# Patient Record
Sex: Female | Born: 1980 | Race: Black or African American | Hispanic: No | Marital: Single | Smoking: Never smoker
Health system: Southern US, Community
[De-identification: ages and names within clinical notes are randomized; demographics above are authoritative.]

## PROBLEM LIST (undated history)

## (undated) DIAGNOSIS — J45909 Unspecified asthma, uncomplicated: Secondary | ICD-10-CM

## (undated) DIAGNOSIS — E669 Obesity, unspecified: Secondary | ICD-10-CM

## (undated) HISTORY — PX: DILATION AND CURETTAGE OF UTERUS: SHX78

---

## 2015-01-01 ENCOUNTER — Emergency Department (HOSPITAL_COMMUNITY)
Admission: EM | Admit: 2015-01-01 | Discharge: 2015-01-01 | Disposition: A | Discharge status: Home / Self Care | Payer: Self-pay | Attending: Emergency Medicine | Admitting: Emergency Medicine

## 2015-01-01 ENCOUNTER — Encounter (HOSPITAL_COMMUNITY): Payer: Self-pay | Admitting: *Deleted

## 2015-01-01 DIAGNOSIS — E669 Obesity, unspecified: Secondary | ICD-10-CM | POA: Insufficient documentation

## 2015-01-01 DIAGNOSIS — N939 Abnormal uterine and vaginal bleeding, unspecified: Secondary | ICD-10-CM | POA: Insufficient documentation

## 2015-01-01 DIAGNOSIS — R109 Unspecified abdominal pain: Secondary | ICD-10-CM

## 2015-01-01 DIAGNOSIS — N390 Urinary tract infection, site not specified: Secondary | ICD-10-CM | POA: Insufficient documentation

## 2015-01-01 DIAGNOSIS — J45909 Unspecified asthma, uncomplicated: Secondary | ICD-10-CM | POA: Insufficient documentation

## 2015-01-01 HISTORY — DX: Obesity, unspecified: E66.9

## 2015-01-01 HISTORY — DX: Unspecified asthma, uncomplicated: J45.909

## 2015-01-01 LAB — CBC WITH DIFFERENTIAL/PLATELET
Basophils Absolute: 0 10*3/uL (ref 0.0–0.1)
Basophils Relative: 0 % (ref 0–1)
Eosinophils Absolute: 0.1 10*3/uL (ref 0.0–0.7)
Eosinophils Relative: 1 % (ref 0–5)
HEMATOCRIT: 35.3 % — AB (ref 36.0–46.0)
Hemoglobin: 11.7 g/dL — ABNORMAL LOW (ref 12.0–15.0)
LYMPHS PCT: 36 % (ref 12–46)
Lymphs Abs: 2.9 10*3/uL (ref 0.7–4.0)
MCH: 27.7 pg (ref 26.0–34.0)
MCHC: 33.1 g/dL (ref 30.0–36.0)
MCV: 83.6 fL (ref 78.0–100.0)
Monocytes Absolute: 0.5 10*3/uL (ref 0.1–1.0)
Monocytes Relative: 6 % (ref 3–12)
NEUTROS ABS: 4.6 10*3/uL (ref 1.7–7.7)
Neutrophils Relative %: 57 % (ref 43–77)
Platelets: 230 10*3/uL (ref 150–400)
RBC: 4.22 MIL/uL (ref 3.87–5.11)
RDW: 14.3 % (ref 11.5–15.5)
WBC: 8.1 10*3/uL (ref 4.0–10.5)

## 2015-01-01 LAB — URINALYSIS, ROUTINE W REFLEX MICROSCOPIC
GLUCOSE, UA: NEGATIVE mg/dL
Ketones, ur: 15 mg/dL — AB
Nitrite: POSITIVE — AB
PROTEIN: 30 mg/dL — AB
Specific Gravity, Urine: 1.031 — ABNORMAL HIGH (ref 1.005–1.030)
Urobilinogen, UA: 1 mg/dL (ref 0.0–1.0)
pH: 5 (ref 5.0–8.0)

## 2015-01-01 LAB — BASIC METABOLIC PANEL
Anion gap: 9 (ref 5–15)
BUN: 8 mg/dL (ref 6–23)
CHLORIDE: 106 mmol/L (ref 96–112)
CO2: 24 mmol/L (ref 19–32)
Calcium: 8.9 mg/dL (ref 8.4–10.5)
Creatinine, Ser: 0.9 mg/dL (ref 0.50–1.10)
GFR calc non Af Amer: 82 mL/min — ABNORMAL LOW (ref >90)
GLUCOSE: 122 mg/dL — AB (ref 70–99)
Potassium: 4.1 mmol/L (ref 3.5–5.1)
Sodium: 139 mmol/L (ref 135–145)

## 2015-01-01 LAB — WET PREP, GENITAL
TRICH WET PREP: NONE SEEN
Yeast Wet Prep HPF POC: NONE SEEN

## 2015-01-01 LAB — GC/CHLAMYDIA PROBE AMP (~~LOC~~) NOT AT ARMC
Chlamydia: NEGATIVE
Neisseria Gonorrhea: NEGATIVE

## 2015-01-01 LAB — URINE MICROSCOPIC-ADD ON

## 2015-01-01 LAB — HIV ANTIBODY (ROUTINE TESTING W REFLEX): HIV SCREEN 4TH GENERATION: NONREACTIVE

## 2015-01-01 LAB — I-STAT BETA HCG BLOOD, ED (MC, WL, AP ONLY): I-stat hCG, quantitative: <5 m[IU]/mL (ref <5)

## 2015-01-01 MED ORDER — CEPHALEXIN 500 MG PO CAPS: 500.0000 mg | ORAL_CAPSULE | Freq: Two times a day (BID) | ORAL | Status: DC

## 2015-01-01 NOTE — Discharge Instructions (Signed)

## 2015-01-01 NOTE — ED Provider Notes (Signed)
CSN: [id number]     Arrival date & time 01/01/15  0720 History   First MD Initiated Contact with Patient 01/01/15 [phone]     Chief Complaint  Patient presents with  . Abdominal Pain     (Consider location/radiation/quality/duration/timing/severity/associated sxs/prior Treatment) HPI Comments: Pt comes in because she thinks she is having a miscarriage. Pt states that she has been pregnant 24 times previous with 8 live birth and 8 spontaneous miscarriages and 8 elective abortions. She states that she doesn't have regular periods and she is unsure of when the last period was. State that she started having a large about of bleeding with clots and generalized lower abdominal cramping. Denies dysuria, fever, n/v/d  The history is provided by the patient. No language interpreter was used.    Past Medical History  Diagnosis Date  . Obesity   . Asthma    History reviewed. No pertinent past surgical history. History reviewed. No pertinent family history. History  Substance Use Topics  . Smoking status: Not on file  . Smokeless tobacco: Not on file  . Alcohol Use: No   OB History    No data available     Review of Systems  All other systems reviewed and are negative.     Allergies  Review of patient's allergies indicates no known allergies.  Home Medications   Prior to Admission medications   Not on File   BP 141/74 mmHg  Pulse 70  Temp(Src) 97.9 F (36.6 C) (Oral)  Resp 20  Ht 5\' 2"  (1.575 m)  Wt 210 lb (95.255 kg)  BMI 38.40 kg/m2  SpO2 99% Physical Exam  Constitutional: She is oriented to person, place, and time. She appears well-developed and well-nourished.  HENT:  Head: Normocephalic and atraumatic.  Cardiovascular: Normal rate.   Pulmonary/Chest: Effort normal and breath sounds normal.  Abdominal: Soft. Bowel sounds are normal. There is no tenderness.  Genitourinary:  Mild amount of blood in vault  Musculoskeletal: Normal range of motion.  Neurological: She  is alert and oriented to person, place, and time.  Skin: Skin is warm and dry.  Nursing note and vitals reviewed.   ED Course  Procedures (including critical care time) Labs Review Labs Reviewed  WET PREP, GENITAL - Abnormal; Notable for the following:    Clue Cells Wet Prep HPF POC FEW (*)    WBC, Wet Prep HPF POC MODERATE (*)    All other components within normal limits  URINALYSIS, ROUTINE W REFLEX MICROSCOPIC - Abnormal; Notable for the following:    Color, Urine AMBER (*)    APPearance CLOUDY (*)    Specific Gravity, Urine 1.031 (*)    Hgb urine dipstick LARGE (*)    Bilirubin Urine SMALL (*)    Ketones, ur 15 (*)    Protein, ur 30 (*)    Nitrite POSITIVE (*)    Leukocytes, UA TRACE (*)    All other components within normal limits  BASIC METABOLIC PANEL - Abnormal; Notable for the following:    Glucose, Bld 122 (*)    GFR calc non Af Amer 82 (*)    All other components within normal limits  CBC WITH DIFFERENTIAL/PLATELET - Abnormal; Notable for the following:    Hemoglobin 11.7 (*)    HCT 35.3 (*)    All other components within normal limits  URINE MICROSCOPIC-ADD ON - Abnormal; Notable for the following:    Squamous Epithelial / LPF FEW (*)    Bacteria, UA MANY (*)  All other components within normal limits  HIV ANTIBODY (ROUTINE TESTING)  I-STAT BETA HCG BLOOD, ED ([facility], [facility], [facility] ONLY)  GC/CHLAMYDIA PROBE AMP ([place])    Imaging Review No results found.   EKG Interpretation None      MDM   Final diagnoses:  UTI (lower urinary tract infection)  Vaginal bleeding  Abdominal cramping    Pt is not pregnant and she left prior to discharge and getting script for uti    Teressa Lower, NP 01/01/15 1708  Gerhard Munch, MD 01/01/15 1718

## 2015-01-01 NOTE — ED Notes (Signed)
Pt still not in room. Checked lobby and waiting room with no response. NP and primary RN made aware.

## 2015-01-01 NOTE — ED Notes (Signed)
Pt noted to no longer be in her room.

## 2015-01-01 NOTE — ED Notes (Signed)
PT left prior to receiving discharge instructions or discharge vitals. Pt in NAD.

## 2015-01-01 NOTE — ED Notes (Signed)
Pt reports being pregnant, estimated 4-5 weeks. Began having lower abd pain and vaginal bleeding that started at 1am.

## 2017-10-19 ENCOUNTER — Encounter (HOSPITAL_COMMUNITY): Payer: Self-pay | Admitting: Emergency Medicine

## 2017-10-19 ENCOUNTER — Other Ambulatory Visit: Payer: Self-pay

## 2017-10-19 ENCOUNTER — Emergency Department (HOSPITAL_COMMUNITY)
Admission: EM | Admit: 2017-10-19 | Discharge: 2017-10-20 | Disposition: A | Discharge status: Home / Self Care | Payer: Self-pay | Attending: Emergency Medicine | Admitting: Emergency Medicine

## 2017-10-19 DIAGNOSIS — J069 Acute upper respiratory infection, unspecified: Secondary | ICD-10-CM | POA: Insufficient documentation

## 2017-10-19 DIAGNOSIS — J349 Unspecified disorder of nose and nasal sinuses: Secondary | ICD-10-CM | POA: Insufficient documentation

## 2017-10-19 DIAGNOSIS — J3489 Other specified disorders of nose and nasal sinuses: Secondary | ICD-10-CM | POA: Insufficient documentation

## 2017-10-19 DIAGNOSIS — J45909 Unspecified asthma, uncomplicated: Secondary | ICD-10-CM | POA: Insufficient documentation

## 2017-10-19 DIAGNOSIS — B9789 Other viral agents as the cause of diseases classified elsewhere: Secondary | ICD-10-CM | POA: Insufficient documentation

## 2017-10-19 DIAGNOSIS — Z79899 Other long term (current) drug therapy: Secondary | ICD-10-CM | POA: Insufficient documentation

## 2017-10-19 DIAGNOSIS — R0981 Nasal congestion: Secondary | ICD-10-CM | POA: Insufficient documentation

## 2017-10-19 NOTE — ED Triage Notes (Signed)
Pt states she works in [employer] service and her employers wanted her to be checked for nasal congestion and a cough. Denies shortness of breath/chest pain, pt afebrile. Requesting a work note.

## 2017-10-20 MED ORDER — BENZONATATE 100 MG PO CAPS
100.0000 mg | ORAL_CAPSULE | Freq: Once | ORAL | Status: AC
  Administered 2017-10-20: 100 mg via ORAL
  Filled 2017-10-20: qty 1

## 2017-10-20 MED ORDER — IPRATROPIUM-ALBUTEROL 0.5-2.5 (3) MG/3ML IN SOLN
3.0000 mL | Freq: Once | RESPIRATORY_TRACT | Status: AC
  Administered 2017-10-20: 3 mL via RESPIRATORY_TRACT
  Filled 2017-10-20: qty 3

## 2017-10-20 MED ORDER — FLUTICASONE PROPIONATE 50 MCG/ACT NA SUSP: 2.0000 | Freq: Every day | NASAL | 12 refills | Status: DC

## 2017-10-20 MED ORDER — BENZONATATE 100 MG PO CAPS: 100.0000 mg | ORAL_CAPSULE | Freq: Three times a day (TID) | ORAL | 0 refills | Status: DC

## 2017-10-20 NOTE — Discharge Instructions (Signed)
This is likely a viral illness.  Do not feel that you need antibiotics at this time.  Encourage symptomatic treatment at home.  Have given you Tessalon for cough.  Use the Flonase for nasal congestion.  Use over-the-counter decongestants as well.  Return to the ED with any worsening symptoms.

## 2017-10-20 NOTE — ED Notes (Signed)
Pt departed in NAD, refused use of wheelchair.  

## 2017-10-20 NOTE — ED Provider Notes (Signed)
[facility] Provider Note   CSN: [id number] Arrival date & time: 10/19/17  2242     History   Chief Complaint Chief Complaint  Patient presents with  . Cough    HPI Amy Reynolds is a 36 y.o. female.  HPI 36 year old African-American female past medical history significant for asthma presents to the emergency department today for evaluation of nasal congestion, cough.  The patient states that for the past 3 days she has had nasal congestion, sinus pressure, rhinorrhea, cough.  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  States that she has a history of asthma.  Does not feel like she is wheezing though.  States that her cough is not productive.  She reports a sore throat only with cough.  She reports green yellow rhinorrhea and nasal discharge.  Reports sinus pressure.  She has not tried any over-the-counter medications for her symptoms.  States that her cough is aggravating her.  She also states that her work is requesting a return to work note to make sure that she is okay to work around food.  Patient denies any associated fevers, chills, chest pain, shortness of breath, otalgia, neck pain. Past Medical History:  Diagnosis Date  . Asthma   . Obesity     There are no active problems to display for this patient.   History reviewed. No pertinent surgical history.  OB History    No data available       Home Medications    Prior to Admission medications   Medication Sig Start Date End Date Taking? Authorizing Provider  benzonatate (TESSALON) 100 MG capsule Take 1 capsule (100 mg total) by mouth every 8 (eight) hours. 10/20/17   Rise MuLeaphart, Kenneth T, PA-C  cephALEXin (KEFLEX) 500 MG capsule Take 1 capsule (500 mg total) by mouth 2 (two) times daily. 01/01/15   Teressa LowerPickering, Vrinda, NP  fluticasone (FLONASE) 50 MCG/ACT nasal spray Place 2 sprays  into both nostrils daily. 10/20/17   Rise MuLeaphart, Kenneth T, PA-C    Family History No family history on file.  Social History Social History   Tobacco Use  . Smoking status: Never Smoker  . Smokeless tobacco: Never Used  Substance Use Topics  . Alcohol use: No  . Drug use: No     Allergies   Patient has no known allergies.   Review of Systems Review of Systems  Constitutional: Negative for chills and fever.  HENT: Positive for congestion, postnasal drip, rhinorrhea, sinus pressure, sinus pain, sneezing and sore throat. Negative for ear pain.   Respiratory: Positive for cough. Negative for wheezing.   Cardiovascular: Negative for chest pain.  Gastrointestinal: Negative for nausea.  Musculoskeletal: Negative for myalgias, neck pain and neck stiffness.  Skin: Negative for rash.  Neurological: Negative for headaches.     Physical Exam Updated Vital Signs BP 126/66 (BP Location: Right Arm)   Pulse 77   Temp 98 F (36.7 C) (Oral)   Resp 16   Ht 5\' 2"  (1.575 m)   Wt 96.2 kg (212 lb)   SpO2 100%   BMI 38.78 kg/m   Physical Exam  Constitutional: She appears well-developed and well-nourished. No distress.  HENT:  Head: Normocephalic and atraumatic.  Right Ear: Tympanic membrane, external ear and ear canal normal.  Left Ear: Tympanic membrane, external ear and ear canal normal.  Nose: Mucosal edema and rhinorrhea present.  Right sinus exhibits maxillary sinus tenderness and frontal sinus tenderness. Left sinus exhibits maxillary sinus tenderness and frontal sinus tenderness.  Mouth/Throat: Uvula is midline, oropharynx is clear and moist and mucous membranes are normal. No trismus in the jaw. No uvula swelling. No tonsillar exudate.  Eyes: Right eye exhibits no discharge. Left eye exhibits no discharge. No scleral icterus.  Neck: Normal range of motion. Neck supple.  No c spine midline tenderness. No paraspinal tenderness. No deformities or step offs noted. Full ROM.  Supple. No nuchal rigidity.    Pulmonary/Chest: Effort normal and breath sounds normal. No stridor. No respiratory distress. She has no wheezes. She has no rales. She exhibits no tenderness.  Musculoskeletal: Normal range of motion.  No lower extremity edema or calf tenderness.  Neurological: She is alert.  Skin: Skin is warm and dry. Capillary refill takes less than 2 seconds. No pallor.  Psychiatric: Her behavior is normal. Judgment and thought content normal.  Nursing note and vitals reviewed.    ED Treatments / Results  Labs (all labs ordered are listed, but only abnormal results are displayed) Labs Reviewed - No data to display  EKG  EKG Interpretation None       Radiology No results found.  Procedures Procedures (including critical care time)  Medications Ordered in ED Medications  benzonatate (TESSALON) capsule 100 mg (100 mg Oral Given 10/20/17 0044)  ipratropium-albuterol (DUONEB) 0.5-2.5 (3) MG/3ML nebulizer solution 3 mL (3 mLs Nebulization Given 10/20/17 0047)     Initial Impression / Assessment and Plan / ED Course  I have reviewed the triage vital signs and the nursing notes.  Pertinent labs & imaging results that were available during my care of the patient were reviewed by me and considered in my medical decision making (see chart for details).     Patient resents for evaluation of nasal congestion, cough, sinus pressure.  Patients symptoms are consistent with URI, likely viral etiology.  I offered patient chest x-ray which she would like to decline at this time.  Her lungs clear to auscultation bilaterally.  Low suspicion for pneumonia.  Clinical presentation is not consistent with ACS.  Did offer patient cough medicine and breathing treatment however there is no wheezing noted.  This was for more symptomatic relief.  Discussed that antibiotics are not indicated for viral infections. Pt will be discharged with symptomatic treatment.  Verbalizes  understanding and is agreeable with plan. Pt is hemodynamically stable & in NAD prior to dc.   Final Clinical Impressions(s) / ED Diagnoses   Final diagnoses:  Viral URI with cough    ED Discharge Orders        Ordered    benzonatate (TESSALON) 100 MG capsule  Every 8 hours     10/20/17 0055    fluticasone (FLONASE) 50 MCG/ACT nasal spray  Daily     10/20/17 0055       Rise MuLeaphart, Kenneth T, PA-C 10/20/17 0112    Glynn Octaveancour, Stephen, MD 10/20/17 [phone]

## 2017-10-22 ENCOUNTER — Encounter (HOSPITAL_COMMUNITY): Payer: Self-pay | Admitting: Family Medicine

## 2017-10-22 DIAGNOSIS — R0981 Nasal congestion: Secondary | ICD-10-CM | POA: Insufficient documentation

## 2017-10-22 DIAGNOSIS — Z5321 Procedure and treatment not carried out due to patient leaving prior to being seen by health care provider: Secondary | ICD-10-CM | POA: Insufficient documentation

## 2017-10-22 DIAGNOSIS — J029 Acute pharyngitis, unspecified: Secondary | ICD-10-CM | POA: Insufficient documentation

## 2017-10-22 DIAGNOSIS — R05 Cough: Secondary | ICD-10-CM | POA: Insufficient documentation

## 2017-10-22 LAB — RAPID STREP SCREEN (MED CTR MEBANE ONLY): STREPTOCOCCUS, GROUP A SCREEN (DIRECT): NEGATIVE

## 2017-10-22 NOTE — ED Triage Notes (Signed)
Patient is complaining of upper respiratory symptoms such as  nasal congestion, sinus pressure, rhinorrhea, cough and sore throat. [facility] for all the symptoms except sore throat. Unsure of fever.

## 2017-10-23 ENCOUNTER — Emergency Department (HOSPITAL_COMMUNITY)
Admission: EM | Admit: 2017-10-23 | Discharge: 2017-10-23 | Disposition: A | Discharge status: Home / Self Care | Payer: Self-pay | Attending: Emergency Medicine | Admitting: Emergency Medicine

## 2017-10-23 NOTE — ED Notes (Signed)
Patient called for assigned room with no answer.  

## 2017-10-23 NOTE — ED Notes (Signed)
Bed: WTR6 Expected date:  Expected time:  Means of arrival:  Comments: 

## 2017-10-25 LAB — CULTURE, GROUP A STREP (THRC)

## 2017-11-06 NOTE — L&D Delivery Note (Signed)
Patient: Amy Reynolds MRN: [id number]  GBS status: GBS(+), IAP PCN given  Patient is a 37 y.o. now [id number] s/p NSVD at [redacted]w[redacted]d, who was admitted for IOL for GDM of unknown control. AROM 1h 4m prior to delivery with clear fluid.    Delivery Note At 12:26 AM a viable female was delivered via Vaginal, Spontaneous (Presentation: vertex).  APGAR: ; weight pending.   Placenta status: intact.  Cord: 3 vessel with the following complications: none.    Anesthesia:  Epidural Episiotomy:  No Lacerations:  1st degree perineal Suture Repair: Hemostatic, no repair needed Est. Blood Loss (mL): 380   Mom to postpartum.  Baby to Couplet care / Skin to Skin.  Amy Reynolds E Amy Reynolds 09/05/2018, 12:48 AM     Head delivered LOA. Nuchal x1. Shoulder and body delivered in usual fashion. Infant with spontaneous cry, placed on mother's abdomen, dried and bulb suctioned. Cord clamped x 2 after 1-minute delay, and cut by family member. Cord blood drawn. Placenta delivered spontaneously with gentle cord traction. Fundus firm with massage and Pitocin. Perineum inspected and found to have 1st degree laceration, which was found to be hemostatic.

## 2018-01-07 ENCOUNTER — Emergency Department (HOSPITAL_COMMUNITY)
Admission: EM | Admit: 2018-01-07 | Discharge: 2018-01-07 | Disposition: A | Discharge status: Home / Self Care | Payer: Medicaid Other | Attending: Emergency Medicine | Admitting: Emergency Medicine

## 2018-01-07 ENCOUNTER — Other Ambulatory Visit: Payer: Self-pay

## 2018-01-07 ENCOUNTER — Encounter (HOSPITAL_COMMUNITY): Payer: Self-pay

## 2018-01-07 ENCOUNTER — Emergency Department (HOSPITAL_COMMUNITY): Payer: Medicaid Other

## 2018-01-07 DIAGNOSIS — Y999 Unspecified external cause status: Secondary | ICD-10-CM | POA: Insufficient documentation

## 2018-01-07 DIAGNOSIS — Z79899 Other long term (current) drug therapy: Secondary | ICD-10-CM | POA: Diagnosis not present

## 2018-01-07 DIAGNOSIS — Y929 Unspecified place or not applicable: Secondary | ICD-10-CM | POA: Insufficient documentation

## 2018-01-07 DIAGNOSIS — Y939 Activity, unspecified: Secondary | ICD-10-CM | POA: Diagnosis not present

## 2018-01-07 DIAGNOSIS — M545 Low back pain, unspecified: Secondary | ICD-10-CM

## 2018-01-07 DIAGNOSIS — W108XXA Fall (on) (from) other stairs and steps, initial encounter: Secondary | ICD-10-CM | POA: Diagnosis not present

## 2018-01-07 DIAGNOSIS — O9989 Other specified diseases and conditions complicating pregnancy, childbirth and the puerperium: Secondary | ICD-10-CM | POA: Diagnosis present

## 2018-01-07 DIAGNOSIS — O99511 Diseases of the respiratory system complicating pregnancy, first trimester: Secondary | ICD-10-CM | POA: Diagnosis not present

## 2018-01-07 DIAGNOSIS — J45909 Unspecified asthma, uncomplicated: Secondary | ICD-10-CM | POA: Diagnosis not present

## 2018-01-07 DIAGNOSIS — N939 Abnormal uterine and vaginal bleeding, unspecified: Secondary | ICD-10-CM

## 2018-01-07 DIAGNOSIS — Z3A01 Less than 8 weeks gestation of pregnancy: Secondary | ICD-10-CM | POA: Diagnosis not present

## 2018-01-07 DIAGNOSIS — W19XXXA Unspecified fall, initial encounter: Secondary | ICD-10-CM

## 2018-01-07 LAB — I-STAT BETA HCG BLOOD, ED (MC, WL, AP ONLY): I-stat hCG, quantitative: 331.6 m[IU]/mL — ABNORMAL HIGH (ref <5)

## 2018-01-07 MED ORDER — ACETAMINOPHEN 325 MG PO TABS
650.0000 mg | ORAL_TABLET | Freq: Once | ORAL | Status: AC
  Administered 2018-01-07: 650 mg via ORAL
  Filled 2018-01-07: qty 2

## 2018-01-07 MED ORDER — PRENATAL COMPLETE 14-0.4 MG PO TABS: 1.0000 | ORAL_TABLET | Freq: Every day | ORAL | 0 refills | Status: DC

## 2018-01-07 NOTE — ED Triage Notes (Signed)
Patient reports that she tripped and fell this am, no loc. Complains of left side pain. Also thinks she may be pregnant

## 2018-01-07 NOTE — ED Provider Notes (Signed)
[facility] EMERGENCY DEPARTMENT Provider Note   CSN: [id number] Arrival date & time: 01/07/18  0846     History   Chief Complaint Chief Complaint  Patient presents with  . Fall    HPI Amy Reynolds is a 37 y.o. female.  Amy Reynolds is a 37 y.o. Female with a history of obesity and asthma, presents to the ED for evaluation after she tripped and fell down several stairs today.  Patient reports she fell down the stairs on her bottom did not hit her head no loss of consciousness.  Patient complaining primarily of pain across her left lower back, reports it feels like muscle soreness.  She denies any numbness, tingling or weakness in her lower extremities.  No loss of bowel or bladder control.  No abdominal pain or urinary symptoms.  Patient does report she thinks she is about [redacted] weeks pregnant has had multiple positive pregnancy tests at home, her period is irregular so it is difficult to tell exactly how far along she is.  Again patient denies abdominal pain, but does report some spotting this morning after the fall, and is concerned for miscarriage.  No fevers or chills, no no nausea or vomiting.  Any continued vaginal bleeding patient denies any continued vaginal bleeding since fall.      Past Medical History:  Diagnosis Date  . Asthma   . Obesity     There are no active problems to display for this patient.   Past Surgical History:  Procedure Laterality Date  . DILATION AND CURETTAGE OF UTERUS      OB History    No data available       Home Medications    Prior to Admission medications   Medication Sig Start Date End Date Taking? Authorizing Provider  benzonatate (TESSALON) 100 MG capsule Take 1 capsule (100 mg total) by mouth every 8 (eight) hours. 10/20/17   Rise Mu, PA-C  cephALEXin (KEFLEX) 500 MG capsule Take 1 capsule (500 mg total) by mouth 2 (two) times daily. 01/01/15   Teressa Lower, NP  fluticasone (FLONASE) 50 MCG/ACT  nasal spray Place 2 sprays into both nostrils daily. 10/20/17   Rise Mu, PA-C    Family History No family history on file.  Social History Social History   Tobacco Use  . Smoking status: Never Smoker  . Smokeless tobacco: Never Used  Substance Use Topics  . Alcohol use: No  . Drug use: No     Allergies   Patient has no known allergies.   Review of Systems Review of Systems  Constitutional: Negative for chills and fever.  HENT: Negative.   Eyes: Negative for photophobia and visual disturbance.  Respiratory: Negative for cough and shortness of breath.   Cardiovascular: Negative for chest pain.  Gastrointestinal: Negative for abdominal pain, nausea and vomiting.  Genitourinary: Positive for vaginal bleeding. Negative for pelvic pain and vaginal pain.  Musculoskeletal: Positive for back pain and myalgias. Negative for arthralgias, gait problem, neck pain and neck stiffness.  Skin: Negative for color change, pallor, rash and wound.  Neurological: Negative for dizziness, syncope, light-headedness and headaches.     Physical Exam Updated Vital Signs BP 110/63 (BP Location: Right Arm)   Pulse 85   Temp 98.8 F (37.1 C) (Oral)   Resp 16   Ht 5\' 2"  (1.575 m)   Wt 99.8 kg (220 lb)   SpO2 99%   BMI 40.24 kg/m   Physical Exam  Constitutional: She  appears well-developed and well-nourished. No distress.  HENT:  Head: Normocephalic and atraumatic.  Eyes: Right eye exhibits no discharge. Left eye exhibits no discharge.  Neck: Normal range of motion. Neck supple.  C-spine nontender palpation at midline or paraspinally, normal active range of motion of the neck  Cardiovascular: Normal rate, regular rhythm, normal heart sounds and intact distal pulses.  Pulmonary/Chest: Effort normal and breath sounds normal. No stridor. No respiratory distress. She has no wheezes. She has no rales. She exhibits no tenderness.  No tenderness to palpation of the chest wall    Abdominal: Soft. Bowel sounds are normal. She exhibits no distension and no mass. There is no tenderness. There is no guarding.  Abdomen nontender to palpation in all quadrants, no peritoneal signs  Musculoskeletal:  T-spine and L-spine nontender to palpation at midline there is mild tenderness to palpation over the left side of the lumbar back, no ecchymosis or palpable deformity All joints supple easily movable and nontender, all compartments soft  Neurological: She is alert. Coordination normal.  Normal strength of lower extremities at proximal and distal muscles, sensation intact bilaterally, bilateral patellar DTRs 2+ and equal.  Skin: Skin is warm and dry. Capillary refill takes less than 2 seconds. She is not diaphoretic.  Psychiatric: She has a normal mood and affect. Her behavior is normal.  Nursing note and vitals reviewed.    ED Treatments / Results  Labs (all labs ordered are listed, but only abnormal results are displayed) Labs Reviewed  I-STAT BETA HCG BLOOD, ED ([facility], [facility], [facility] ONLY) - Abnormal; Notable for the following components:      Result Value   I-stat hCG, quantitative 331.6 (*)    All other components within normal limits    EKG  EKG Interpretation None       Radiology US Ob Comp < 14 Wks  Result Date: 01/07/2018 CLINICAL DATA:  Vaginal bleeding EXAM: OBSTETRIC <14 WK [place] AND TRANSVAGINAL OB US TECHNIQUE: Both transabdominal and transvaginal ultrasound examinations were performed for complete evaluation of the gestation as well as the maternal uterus, adnexal regions, and pelvic cul-de-sac. Transvaginal technique was performed to assess early pregnancy. COMPARISON:  None. FINDINGS: Intrauterine gestational sac: Possible very early small gestational sac versus pseudo gestational sac Yolk sac:  None visualized Embryo:  None visualized Cardiac Activity: Heart Rate:   bpm MSD: 1.9 mm, too small to date CRL:    mm    w    d                  [place] EDC: Subchorionic  hemorrhage:  Possible small subchorionic hemorrhage. Maternal uterus/adnexae: No adnexal mass. Trace free fluid in the pelvis. IMPRESSION: Question very early intrauterine gestational sac, 1.9 mm mean sac diameter, too small to date. Adjacent very small subchorionic hemorrhage suspected. This could be followed with repeat ultrasound in 14 days to evaluate for change. Electronically Signed   By: Charlett Nose M.D.   On: 01/07/2018 11:25   US Pelvis Transvanginal Non-ob (tv Only)  Result Date: 01/07/2018 CLINICAL DATA:  Vaginal bleeding EXAM: OBSTETRIC <14 WK [place] AND TRANSVAGINAL OB US TECHNIQUE: Both transabdominal and transvaginal ultrasound examinations were performed for complete evaluation of the gestation as well as the maternal uterus, adnexal regions, and pelvic cul-de-sac. Transvaginal technique was performed to assess early pregnancy. COMPARISON:  None. FINDINGS: Intrauterine gestational sac: Possible very early small gestational sac versus pseudo gestational sac Yolk sac:  None visualized Embryo:  None visualized Cardiac Activity: Heart  Rate:   bpm MSD: 1.9 mm, too small to date CRL:    mm    w    d                  US EDC: Subchorionic hemorrhage:  Possible small subchorionic hemorrhage. Maternal uterus/adnexae: No adnexal mass. Trace free fluid in the pelvis. IMPRESSION: Question very early intrauterine gestational sac, 1.9 mm mean sac diameter, too small to date. Adjacent very small subchorionic hemorrhage suspected. This could be followed with repeat ultrasound in 14 days to evaluate for change. Electronically Signed   By: Charlett NoseKevin  Dover M.D.   On: 01/07/2018 11:25    Procedures Procedures (including critical care time)  Medications Ordered in ED Medications  acetaminophen (TYLENOL) tablet 650 mg (650 mg Oral Given 01/07/18 1153)     Initial Impression / Assessment and Plan / ED Course  I have reviewed the triage vital signs and the nursing notes.  Pertinent labs & imaging results that were  available during my care of the patient were reviewed by me and considered in my medical decision making (see chart for details).  Patient presents to the ED for evaluation after she fell down a few stairs on her bottom this morning, no head trauma no loss of consciousness.  Patient does think she is about [redacted] weeks pregnant, positive i-STAT hCG here in the ED.  Patient denies any abdominal pain or cramping, but does report one episode of spotting after the fall, no persistent vaginal bleeding or pelvic discomfort.  Patient has not followed up with OB yet has not had an ultrasound yet for this pregnancy.  Abdomen nontender palpation there is mild tenderness over the left side of the low back but no midline spinal tenderness, will defer pelvic exam at this time given patient is not having active bleeding, pt is hemodynamically stable, but discussed with patient and she would prefer to do an ultrasound here today for reassurance.  Will also send off formal quantitative hCG for future comparison at follow-up.  Patient discussed with Dr. Deretha EmoryZackowski who is in agreement with plan.  Final Clinical Impressions(s) / ED Diagnoses   Final diagnoses:  Fall, initial encounter  Acute left-sided low back pain without sciatica  Less than [redacted] weeks gestation of pregnancy    ED Discharge Orders        Ordered    Prenatal Vit-Fe Fumarate-FA (PRENATAL COMPLETE) 14-0.4 MG TABS  Daily     01/07/18 1157       Dartha LodgeFord, Kelsey N, [place] JerseyPA-C 01/07/18 1709    Vanetta MuldersZackowski, Scott, MD 01/08/18 [phone]

## 2018-01-07 NOTE — Discharge Instructions (Signed)
Your ultrasound today shows a very early pregnancy, and hCG test suggests you are about [redacted] weeks along.  Please begin taking daily prenatal vitamin.  [facility], recommend repeat ultrasound in about 2 weeks.  You may use Tylenol, ice and heat for your left-sided back pain.  If you develop abdominal pain or vaginal bleeding he may return here to the ED or [facility].  If you have weakness numbness and tingling in your legs, loss of control of your bowel or bladder or severely worsening back pain please return to the ED.

## 2018-01-07 NOTE — ED Notes (Signed)
Pt in Ultrasound

## 2018-03-23 ENCOUNTER — Emergency Department (HOSPITAL_COMMUNITY)
Admission: EM | Admit: 2018-03-23 | Discharge: 2018-03-24 | Discharge status: Against Medical Advice | Payer: Medicaid Other | Attending: Emergency Medicine | Admitting: Emergency Medicine

## 2018-03-23 ENCOUNTER — Other Ambulatory Visit: Payer: Self-pay

## 2018-03-23 ENCOUNTER — Encounter (HOSPITAL_COMMUNITY): Payer: Self-pay | Admitting: Emergency Medicine

## 2018-03-23 ENCOUNTER — Emergency Department (HOSPITAL_COMMUNITY): Payer: Medicaid Other

## 2018-03-23 DIAGNOSIS — O23599 Infection of other part of genital tract in pregnancy, unspecified trimester: Secondary | ICD-10-CM | POA: Insufficient documentation

## 2018-03-23 DIAGNOSIS — R05 Cough: Secondary | ICD-10-CM | POA: Insufficient documentation

## 2018-03-23 DIAGNOSIS — Z3A15 15 weeks gestation of pregnancy: Secondary | ICD-10-CM | POA: Diagnosis not present

## 2018-03-23 DIAGNOSIS — O200 Threatened abortion: Secondary | ICD-10-CM | POA: Diagnosis not present

## 2018-03-23 DIAGNOSIS — B9689 Other specified bacterial agents as the cause of diseases classified elsewhere: Secondary | ICD-10-CM

## 2018-03-23 DIAGNOSIS — W19XXXA Unspecified fall, initial encounter: Secondary | ICD-10-CM

## 2018-03-23 DIAGNOSIS — R059 Cough, unspecified: Secondary | ICD-10-CM

## 2018-03-23 DIAGNOSIS — O209 Hemorrhage in early pregnancy, unspecified: Secondary | ICD-10-CM | POA: Diagnosis present

## 2018-03-23 DIAGNOSIS — R1030 Lower abdominal pain, unspecified: Secondary | ICD-10-CM | POA: Diagnosis not present

## 2018-03-23 DIAGNOSIS — N760 Acute vaginitis: Secondary | ICD-10-CM

## 2018-03-23 DIAGNOSIS — Z79899 Other long term (current) drug therapy: Secondary | ICD-10-CM | POA: Insufficient documentation

## 2018-03-23 DIAGNOSIS — O4690 Antepartum hemorrhage, unspecified, unspecified trimester: Secondary | ICD-10-CM

## 2018-03-23 LAB — CBC WITH DIFFERENTIAL/PLATELET
Abs Immature Granulocytes: 0.3 10*3/uL — ABNORMAL HIGH (ref 0.0–0.1)
BASOS ABS: 0 10*3/uL (ref 0.0–0.1)
BASOS PCT: 0 %
EOS PCT: 1 %
Eosinophils Absolute: 0.1 10*3/uL (ref 0.0–0.7)
HCT: 33.5 % — ABNORMAL LOW (ref 36.0–46.0)
Hemoglobin: 11.1 g/dL — ABNORMAL LOW (ref 12.0–15.0)
Immature Granulocytes: 2 %
LYMPHS PCT: 25 %
Lymphs Abs: 3.6 10*3/uL (ref 0.7–4.0)
MCH: 28 pg (ref 26.0–34.0)
MCHC: 33.1 g/dL (ref 30.0–36.0)
MCV: 84.6 fL (ref 78.0–100.0)
MONO ABS: 1 10*3/uL (ref 0.1–1.0)
Monocytes Relative: 7 %
Neutro Abs: 9.3 10*3/uL — ABNORMAL HIGH (ref 1.7–7.7)
Neutrophils Relative %: 65 %
PLATELETS: 277 10*3/uL (ref 150–400)
RBC: 3.96 MIL/uL (ref 3.87–5.11)
RDW: 13.6 % (ref 11.5–15.5)
WBC: 14.4 10*3/uL — ABNORMAL HIGH (ref 4.0–10.5)

## 2018-03-23 LAB — I-STAT BETA HCG BLOOD, ED (MC, WL, AP ONLY)

## 2018-03-23 LAB — ABO/RH: ABO/RH(D): O POS

## 2018-03-23 MED ORDER — SODIUM CHLORIDE 0.9 % IV BOLUS
1000.0000 mL | Freq: Once | INTRAVENOUS | Status: AC
  Administered 2018-03-23: 1000 mL via INTRAVENOUS

## 2018-03-23 MED ORDER — ALBUTEROL SULFATE (2.5 MG/3ML) 0.083% IN NEBU
5.0000 mg | INHALATION_SOLUTION | Freq: Once | RESPIRATORY_TRACT | Status: AC
Start: 2018-03-23 — End: 2018-03-24
  Administered 2018-03-24: 5 mg via RESPIRATORY_TRACT
  Filled 2018-03-23: qty 6

## 2018-03-23 NOTE — ED Triage Notes (Signed)
Pt fell down the stairs last night, this morning she was experiencing some pelvic "pressure" and some scant pink tinge.  Pt reports a hx of 28 pregnancies, 8 live births and several miscarriages.  She reports [redacted] weeks pregnant.  Been to the health dept once for Prenate

## 2018-03-23 NOTE — ED Provider Notes (Signed)
[facility] EMERGENCY DEPARTMENT Provider Note   CSN: [id number] Arrival date & time: 03/23/18  1909     History   Chief Complaint Chief Complaint  Patient presents with  . Vaginal Bleeding  . Pelvic Pain    HPI Amy Reynolds is a 37 y.o. female G 20 P with a hx of asthma, obesity, anemia presents to the Emergency Department complaining of gradual, persistent, progressively worsening lower abd pain onset this morning.  Pt reports she fell down approx 5 stairs around 2 am.  She reports she was able to get up and walk.  Pt reports she developed lower abd pain around 7am.  At 9am when she got up she noticed she was spotting.  Pt reports intermittent light pink spotting today, but worsening pain throughout the day.   Pt reports swelling of her legs for aprox 4 weeks, but worsening throughout the weekend.  Pt reports she has has a cough for aprox 2 weeks.  Pt reports she had a syncopal episode 2 weeks ago and has had 2 episodes since then.  Pt denies prodrome for syncope.  Pt reports she is currently pregnant and has been evaluated at the health department.  Pt reports DVT (2nd child), preterm birth (3rd child), preeclampsia (7th child).  Pt denies fever, chills, neck pain, chest pain, N/V/D, weakness, dysuria, hematuria. Pt reports associated headaches onset 2 weeks ago.  She reports the headache is throbbing in nature and worsened by her cough.  Denies vision changes.  She reports dizziness with cough as well.  Pt reports she is taking prenatal vitamins and iron tablets.  She denies smoking, EtOh or drug usage.   The history is provided by the patient and medical records. No language interpreter was used.    Past Medical History:  Diagnosis Date  . Asthma   . Obesity     There are no active problems to display for this patient.   Past Surgical History:  Procedure Laterality Date  . DILATION AND CURETTAGE OF UTERUS       OB History    Gravida  1   Para       Term      Preterm      AB      Living        SAB      TAB      Ectopic      Multiple      Live Births               Home Medications    Prior to Admission medications   Medication Sig Start Date End Date Taking? Authorizing Provider  Prenatal Vit-Fe Fumarate-FA (PRENATAL COMPLETE) 14-0.4 MG TABS Take 1 tablet by mouth daily. 01/07/18  Yes Dartha Lodge, PA-C  albuterol (PROVENTIL HFA;VENTOLIN HFA) 108 (90 Base) MCG/ACT inhaler Inhale 2 puffs into the lungs every 4 (four) hours as needed for wheezing or shortness of breath. 03/24/18   Genine Beckett, Dahlia Client, PA-C  benzonatate (TESSALON) 100 MG capsule Take 1 capsule (100 mg total) by mouth every 8 (eight) hours. Patient not taking: Reported on 03/23/2018 10/20/17   Demetrios Loll T, PA-C  cephALEXin (KEFLEX) 500 MG capsule Take 1 capsule (500 mg total) by mouth 2 (two) times daily. Patient not taking: Reported on 03/23/2018 01/01/15   Teressa Lower, NP  fluticasone [facility]) 50 MCG/ACT nasal spray Place 2 sprays into both nostrils daily. Patient not taking: Reported on 03/23/2018 10/20/17  Rise Mu, PA-C  metroNIDAZOLE (FLAGYL) 500 MG tablet Take 1 tablet (500 mg total) by mouth 2 (two) times daily. 03/24/18   Latreshia Beauchaine, Dahlia Client, PA-C  Spacer/Aero-Holding Chambers (AEROCHAMBER PLUS WITH MASK) inhaler Use as instructed 03/24/18   Nikola Marone, Dahlia Client, PA-C    Family History No family history on file.  Social History Social History   Tobacco Use  . Smoking status: Never Smoker  . Smokeless tobacco: Never Used  Substance Use Topics  . Alcohol use: No  . Drug use: No     Allergies   Patient has no known allergies.   Review of Systems Review of Systems  Constitutional: Negative for appetite change, diaphoresis, fatigue, fever and unexpected weight change.  HENT: Negative for mouth sores.   Eyes: Negative for visual disturbance.  Respiratory: Positive for cough. Negative for chest tightness,  shortness of breath and wheezing.   Cardiovascular: Negative for chest pain.  Gastrointestinal: Negative for abdominal pain, constipation, diarrhea, nausea and vomiting.  Endocrine: Negative for polydipsia, polyphagia and polyuria.  Genitourinary: Positive for pelvic pain and vaginal bleeding. Negative for difficulty urinating, dysuria, frequency, hematuria and urgency.  Musculoskeletal: Negative for back pain and neck stiffness.  Skin: Negative for rash.  Allergic/Immunologic: Negative for immunocompromised state.  Neurological: Positive for dizziness and headaches. Negative for syncope and light-headedness.  Hematological: Does not bruise/bleed easily.  Psychiatric/Behavioral: Negative for sleep disturbance. The patient is not nervous/anxious.      Physical Exam Updated Vital Signs BP (!) 147/116 (BP Location: Right Arm) Comment: rt forearm  Pulse 98   Temp 98.4 F (36.9 C) (Oral)   Resp 16   Ht  (1.575 m)   Wt 108.9 kg (240 lb)   SpO2 95%   BMI 43.90 kg/m   Physical Exam  Constitutional: She appears well-developed and well-nourished. No distress.  Awake, alert, nontoxic appearance  HENT:  Head: Normocephalic and atraumatic.  Mouth/Throat: Oropharynx is clear and moist. No oropharyngeal exudate.  Eyes: Conjunctivae are normal. No scleral icterus.  Neck: Normal range of motion. Neck supple.  Cardiovascular: Normal rate, regular rhythm, normal heart sounds and intact distal pulses.  No murmur heard. Pulmonary/Chest: Effort normal and breath sounds normal. No respiratory distress. She has no wheezes.  Equal chest expansion  Abdominal: Soft. Bowel sounds are normal. She exhibits no mass. There is no tenderness. There is no rebound and no guarding. Hernia confirmed negative in the right inguinal area and confirmed negative in the left inguinal area.  Genitourinary: Uterus normal. No labial fusion. There is no rash, tenderness or lesion on the right labia. There is no rash,  tenderness or lesion on the left labia. Uterus is not deviated, not enlarged, not fixed and not tender. Cervix exhibits no motion tenderness, no discharge and no friability. Right adnexum displays no mass, no tenderness and no fullness. Left adnexum displays no mass, no tenderness and no fullness. No erythema, tenderness or bleeding in the vagina. No foreign body in the vagina. No signs of injury around the vagina. Vaginal discharge ( Moderate, thin, white) found.  Genitourinary Comments: Cervical os closed  Musculoskeletal: Normal range of motion. She exhibits no edema.  Lymphadenopathy:       Right: No inguinal adenopathy present.       Left: No inguinal adenopathy present.  Neurological: She is alert.  Speech is clear and goal oriented Moves extremities without ataxia  Skin: Skin is warm and dry. She is not diaphoretic. No erythema.  Psychiatric: She has a normal  mood and affect.  Nursing note and vitals reviewed.    ED Treatments / Results  Labs (all labs ordered are listed, but only abnormal results are displayed) Labs Reviewed  WET PREP, GENITAL - Abnormal; Notable for the following components:      Result Value   Clue Cells Wet Prep HPF POC PRESENT (*)    WBC, Wet Prep HPF POC FEW (*)    All other components within normal limits  CBC WITH DIFFERENTIAL/PLATELET - Abnormal; Notable for the following components:   WBC 14.4 (*)    Hemoglobin 11.1 (*)    HCT 33.5 (*)    Neutro Abs 9.3 (*)    Abs Immature Granulocytes 0.3 (*)    All other components within normal limits  HCG, QUANTITATIVE, PREGNANCY - Abnormal; Notable for the following components:   hCG, Beta Chain, Quant, S 9,362 (*)    All other components within normal limits  COMPREHENSIVE METABOLIC PANEL - Abnormal; Notable for the following components:   Glucose, Bld 124 (*)    Albumin 3.0 (*)    ALT 13 (*)    All other components within normal limits  I-STAT BETA HCG BLOOD, ED ([facility], [facility], [facility] ONLY) - Abnormal; Notable for  the following components:   I-stat hCG, quantitative >2,000.0 (*)    All other components within normal limits  PROTEIN / CREATININE RATIO, URINE  URINALYSIS, ROUTINE W REFLEX MICROSCOPIC  ABO/RH  GC/CHLAMYDIA PROBE AMP ([place]) NOT AT [facility]    EKG EKG Interpretation  Date/Time:  Saturday Mar 23 2018 23:23:09 EDT Ventricular Rate:  81 PR Interval:    QRS Duration: 94 QT Interval:  369 QTC Calculation: 429 R Axis:   75 Text Interpretation:  Sinus rhythm Low voltage, precordial leads No previous tracing Confirmed by Gilda Crease [phone]) on 03/23/2018 11:33:48 PM   Radiology US Ob Limited  Result Date: 03/24/2018 CLINICAL DATA:  Acute onset of vaginal bleeding. Status post fall yesterday. EXAM: LIMITED OBSTETRIC ULTRASOUND FINDINGS: Number of Fetuses: 1 Heart Rate:  149 bpm Movement: Yes Presentation: Cephalic Placental Location: Anterior Previa: No Amniotic Fluid (Subjective):  Within normal limits. BPD: 2.87 cm    15 w  1 d MATERNAL FINDINGS: Cervix:  Appears closed. Uterus/Adnexae: No abnormality visualized. IMPRESSION: Single live intrauterine pregnancy noted, with a biparietal diameter of 2.9 cm, corresponding to a gestational age of [redacted] weeks 1 day. This does not match the gestational age by LMP, and reflects a new estimated date of delivery of September 14, 2018. No evidence of placenta previa.  The cervix remains closed. This exam is performed on an emergent basis and does not comprehensively evaluate fetal size, dating, or anatomy; follow-up complete OB US should be considered if further fetal assessment is warranted. Electronically Signed   By: Roanna Raider M.D.   On: 03/24/2018 01:01    Procedures Procedures (including critical care time)  Medications Ordered in ED Medications  acetaminophen (TYLENOL) tablet 1,000 mg (has no administration in time range)  sodium chloride 0.9 % bolus 1,000 mL (0 mLs Intravenous Stopped 03/24/18 0208)  albuterol (PROVENTIL) (2.5  MG/3ML) 0.083% nebulizer solution 5 mg (5 mg Nebulization Given 03/24/18 0200)     Initial Impression / Assessment and Plan / ED Course  I have reviewed the triage vital signs and the nursing notes.  Pertinent labs & imaging results that were available during my care of the patient were reviewed by me and considered in my medical decision making (see chart for details).  Clinical Course as of Mar 25 551  Sat Mar 23, 2018  2310 Pt hypertensive  BP(!): 147/116 [HM]  Sun Mar 24, 2018  0242 Improvement in breath sounds and cough after albuterol treatment.   [HM]  0341 Improved without intervention.   BP: 127/86 [HM]    Clinical Course User Index [HM] Arieal Cuoco, Dahlia Client, PA-C    Patient presents with numerous complaints.  She reports she fell down several stairs last night and has since had lower abdominal cramping and some light vaginal spotting.  Abdomen is soft and minimally tender on exam.  Pelvic exam with moderate amount of discharge.  Cervical os closed.  No blood in the vaginal vault.  No cervical motion tenderness or adnexal tenderness.  [place] with IUP.    Pt noted to be hypertensive on arrival.  This improved without intervention.  Requested urine sample multiple times from patient.  She is unable to provide one.  No elevation on AST/ALT to suggest HELLP syndrome, however, pt eloped from the ED prior to obtaining a urine sample.  Unknown if patient has protein in her urine.  Pt with a  Hx of pre-eclampsia, but less likely today given gestational age and improvement in BP.  Pt denies vision changes, HA.    Pt also with cough.  She has a hx of asthma.  Albuterol given with improvement in cough and pt reports she is breathing better.  Will write for albuterol MDI at home.  Pt will need close follow-up for this.    BP 127/86 (BP Location: Right Arm)   Pulse 90   Temp 98.5 F (36.9 C) (Oral)   Resp 17   Ht  (1.575 m)   Wt 108.9 kg (240 lb)   SpO2 98%   BMI 43.90 kg/m    Final Clinical Impressions(s) / ED Diagnoses   Final diagnoses:  BV (bacterial vaginosis)  Threatened miscarriage  Lower abdominal pain  Cough    ED Discharge Orders        Ordered    metroNIDAZOLE (FLAGYL) 500 MG tablet  2 times daily     03/24/18 0332    albuterol (PROVENTIL HFA;VENTOLIN HFA) 108 (90 Base) MCG/ACT inhaler  Every 4 hours PRN     03/24/18 0332    Spacer/Aero-Holding Chambers (AEROCHAMBER PLUS WITH MASK) inhaler     03/24/18 0332       Zauria Dombek, Dahlia Client, PA-C 03/24/18 0715    Blane Ohara, MD 03/25/18 0045

## 2018-03-23 NOTE — ED Notes (Signed)
Pt to US with US tech

## 2018-03-24 LAB — COMPREHENSIVE METABOLIC PANEL
ALK PHOS: 62 U/L (ref 38–126)
ALT: 13 U/L — AB (ref 14–54)
AST: 18 U/L (ref 15–41)
Albumin: 3 g/dL — ABNORMAL LOW (ref 3.5–5.0)
Anion gap: 9 (ref 5–15)
BILIRUBIN TOTAL: 0.4 mg/dL (ref 0.3–1.2)
BUN: 6 mg/dL (ref 6–20)
CALCIUM: 9.6 mg/dL (ref 8.9–10.3)
CO2: 25 mmol/L (ref 22–32)
CREATININE: 0.74 mg/dL (ref 0.44–1.00)
Chloride: 103 mmol/L (ref 101–111)
GFR calc Af Amer: >60 mL/min (ref >60)
GFR calc non Af Amer: >60 mL/min (ref >60)
GLUCOSE: 124 mg/dL — AB (ref 65–99)
Potassium: 3.8 mmol/L (ref 3.5–5.1)
Sodium: 137 mmol/L (ref 135–145)
TOTAL PROTEIN: 6.5 g/dL (ref 6.5–8.1)

## 2018-03-24 LAB — WET PREP, GENITAL
Sperm: NONE SEEN
Trich, Wet Prep: NONE SEEN
YEAST WET PREP: NONE SEEN

## 2018-03-24 LAB — HCG, QUANTITATIVE, PREGNANCY: HCG, BETA CHAIN, QUANT, S: 9362 m[IU]/mL — AB (ref <5)

## 2018-03-24 MED ORDER — METRONIDAZOLE 500 MG PO TABS: 500.0000 mg | ORAL_TABLET | Freq: Two times a day (BID) | ORAL | 0 refills | Status: DC

## 2018-03-24 MED ORDER — ACETAMINOPHEN 500 MG PO TABS: 1000.0000 mg | ORAL_TABLET | Freq: Once | ORAL | Status: DC

## 2018-03-24 MED ORDER — AEROCHAMBER PLUS W/MASK MISC: 2 refills | Status: DC

## 2018-03-24 MED ORDER — ALBUTEROL SULFATE HFA 108 (90 BASE) MCG/ACT IN AERS: 2.0000 | INHALATION_SPRAY | RESPIRATORY_TRACT | 3 refills | Status: DC | PRN

## 2018-03-24 NOTE — Discharge Instructions (Addendum)
1. Medications: albuterol for wheezing or cough, flagyl for BV, acetaminophen for pain, usual home medications 2. Treatment: rest, drink plenty of fluids,  3. Follow Up: Please followup with your OB/GYN in 1-2 days for discussion of your diagnoses and further evaluation after today's visit; if you do not have a primary care doctor use the resource guide provided to find one; Please return to the ER (MAU) for worsening abd pain, vomiting, repeat syncope, vaginal bleeding, fever, blood in urine, persistent vomiting or other concerns

## 2018-03-24 NOTE — ED Notes (Signed)
Nurse secretary called RN while in another patients room stating "Get this IV out of my arm, I have not seen a nurse all night." Another RN assisted patient with the request and she left AMA. Provider notified.

## 2018-03-24 NOTE — ED Notes (Signed)
Pt. Says she does not have to urinate, cup placed on side table for pt. When she has to go to the bathroom.

## 2018-03-25 LAB — GC/CHLAMYDIA PROBE AMP (~~LOC~~) NOT AT ARMC
CHLAMYDIA, DNA PROBE: NEGATIVE
NEISSERIA GONORRHEA: NEGATIVE

## 2018-05-07 ENCOUNTER — Other Ambulatory Visit (HOSPITAL_COMMUNITY)
Admission: RE | Admit: 2018-05-07 | Discharge: 2018-05-07 | Disposition: A | Discharge status: Home / Self Care | Payer: Medicaid Other | Source: Ambulatory Visit | Attending: Obstetrics and Gynecology | Admitting: Obstetrics and Gynecology

## 2018-05-07 ENCOUNTER — Encounter: Payer: Self-pay | Admitting: Obstetrics

## 2018-05-07 ENCOUNTER — Ambulatory Visit (INDEPENDENT_AMBULATORY_CARE_PROVIDER_SITE_OTHER): Payer: Medicaid Other | Admitting: Obstetrics and Gynecology

## 2018-05-07 DIAGNOSIS — J45909 Unspecified asthma, uncomplicated: Secondary | ICD-10-CM

## 2018-05-07 DIAGNOSIS — Z86718 Personal history of other venous thrombosis and embolism: Secondary | ICD-10-CM

## 2018-05-07 DIAGNOSIS — O0990 Supervision of high risk pregnancy, unspecified, unspecified trimester: Secondary | ICD-10-CM

## 2018-05-07 DIAGNOSIS — Z641 Problems related to multiparity: Secondary | ICD-10-CM

## 2018-05-07 DIAGNOSIS — Z3009 Encounter for other general counseling and advice on contraception: Secondary | ICD-10-CM

## 2018-05-07 DIAGNOSIS — Z8751 Personal history of pre-term labor: Secondary | ICD-10-CM

## 2018-05-07 DIAGNOSIS — O09212 Supervision of pregnancy with history of pre-term labor, second trimester: Secondary | ICD-10-CM

## 2018-05-07 DIAGNOSIS — O09522 Supervision of elderly multigravida, second trimester: Secondary | ICD-10-CM

## 2018-05-07 DIAGNOSIS — O0992 Supervision of high risk pregnancy, unspecified, second trimester: Secondary | ICD-10-CM

## 2018-05-07 DIAGNOSIS — Z8759 Personal history of other complications of pregnancy, childbirth and the puerperium: Secondary | ICD-10-CM

## 2018-05-07 DIAGNOSIS — O09529 Supervision of elderly multigravida, unspecified trimester: Secondary | ICD-10-CM | POA: Insufficient documentation

## 2018-05-07 HISTORY — DX: Problems related to multiparity: Z64.1

## 2018-05-07 HISTORY — DX: Personal history of pre-term labor: Z87.51

## 2018-05-07 HISTORY — DX: Personal history of other complications of pregnancy, childbirth and the puerperium: Z87.59

## 2018-05-07 HISTORY — DX: Supervision of high risk pregnancy, unspecified, unspecified trimester: O09.90

## 2018-05-07 MED ORDER — ASPIRIN EC 81 MG PO TBEC: 81.0000 mg | DELAYED_RELEASE_TABLET | Freq: Every day | ORAL | 2 refills | Status: DC

## 2018-05-07 MED ORDER — ENOXAPARIN SODIUM 40 MG/0.4ML ~~LOC~~ SOLN: 40.0000 mg | SUBCUTANEOUS | 11 refills | Status: DC

## 2018-05-07 NOTE — Patient Instructions (Signed)

## 2018-05-07 NOTE — Progress Notes (Signed)
Subjective:  Amy KannerMaisha Reynolds is a 37 y.o. [id number] at 4273w4d being seen today for first OB visit. EDD by U/S. Grandmulti pt. H/O numerous EAB and SAB. H/O PTD x 2 in same year 1999, unknown cause. No problems with PTD or PTL in sequence pregnancies. H/O PEC after # 7 pregnancy, H/O DVT with '98 pregnancy.  Np problems with PEC or DVT with sequence pregnancies. H/O Asthma, controlled with PRN MDI. Marland Kitchen.  She is currently monitored for the following issues for this high-risk pregnancy and has Supervision of high risk pregnancy, antepartum; History of preterm delivery; AMA (advanced maternal age) multigravida 35+; Grand multipara; History of pre-eclampsia; Asthma; History of DVT (deep vein thrombosis); and Unwanted fertility on their problem list.  Patient reports no complaints.  Contractions: Not present. Vag. Bleeding: None.  Movement: Present. Denies leaking of fluid.   The following portions of the patient's history were reviewed and updated as appropriate: allergies, current medications, past family history, past medical history, past social history, past surgical history and problem list. Problem list updated.  Objective:   Vitals:   05/07/18 0919  BP: 116/72  Pulse: 95  Weight: 269 lb 4.8 oz (122.2 kg)    Fetal Status:     Movement: Present     General:  Alert, oriented and cooperative. Patient is in no acute distress.  Skin: Skin is warm and dry. No rash noted.   Cardiovascular: Normal heart rate noted  Respiratory: Normal respiratory effort, no problems with respiration noted  Abdomen: Soft, gravid, appropriate for gestational age. Pain/Pressure: Present     Pelvic:  Cervical exam performed        Extremities: Normal range of motion.  Edema: None  Mental Status: Normal mood and affect. Normal behavior. Normal judgment and thought content.   Urinalysis:      Assessment and Plan:  Pregnancy: [id number] at 5573w4d  1. Supervision of high risk pregnancy, antepartum Prenatal care and labs  reviewed today U/S ordered  2. History of preterm delivery Has had several term deliveries since. Do not feel pt is candidate for progesterone therapy  3. Multigravida of advanced maternal age in second trimester Panorama today  4. Grand multipara At risk for PPH  5. History of pre-eclampsia Reviewed with pt Will start BASA Check additional labs  6. Mild asthma, unspecified whether complicated, unspecified whether persistent Stable  7. History of DVT (deep vein thrombosis) Reviewed indications for Lovenox Will start  8. Unwanted fertility Will need to sign BTL papers at 28 week visit  Preterm labor symptoms and general obstetric precautions including but not limited to vaginal bleeding, contractions, leaking of fluid and fetal movement were reviewed in detail with the patient. Please refer to After Visit Summary for other counseling recommendations.  Return in about 1 month (around 06/04/2018) for OB visit.   Hermina StaggersErvin, Dawson Albers L, MD

## 2018-05-07 NOTE — Progress Notes (Signed)
Pt presents for initial NOB visit. This is not a planned pregnancy, FOB does not live with her, and is not supportive.

## 2018-05-08 ENCOUNTER — Telehealth: Payer: Self-pay

## 2018-05-08 LAB — PROTEIN / CREATININE RATIO, URINE
CREATININE, UR: 304.8 mg/dL
Protein, Ur: 49.3 mg/dL
Protein/Creat Ratio: 162 mg/g creat (ref 0–200)

## 2018-05-08 NOTE — Telephone Encounter (Signed)
Pt called stating someone had called her yesterday from our office and left a message but she did not know what it was about. I advised pt that most likely she was being called to notify her of her prescriptions that were sent to the pharmacy yesterday. Pt verbalized understanding.

## 2018-05-13 ENCOUNTER — Encounter: Payer: Self-pay | Admitting: *Deleted

## 2018-05-13 ENCOUNTER — Telehealth: Payer: Self-pay | Admitting: *Deleted

## 2018-05-13 NOTE — Telephone Encounter (Signed)
error 

## 2018-05-14 LAB — COMPREHENSIVE METABOLIC PANEL
A/G RATIO: 1.3 (ref 1.2–2.2)
ALT: 10 IU/L (ref 0–32)
AST: 14 IU/L (ref 0–40)
Albumin: 3.5 g/dL (ref 3.5–5.5)
Alkaline Phosphatase: 65 IU/L (ref 39–117)
BUN / CREAT RATIO: 8 — AB (ref 9–23)
BUN: 5 mg/dL — ABNORMAL LOW (ref 6–20)
Bilirubin Total: <0.2 mg/dL (ref 0.0–1.2)
CALCIUM: 9.1 mg/dL (ref 8.7–10.2)
CHLORIDE: 103 mmol/L (ref 96–106)
CO2: 21 mmol/L (ref 20–29)
Creatinine, Ser: 0.64 mg/dL (ref 0.57–1.00)
GFR calc Af Amer: 131 mL/min/{1.73_m2} (ref >59)
GFR calc non Af Amer: 114 mL/min/{1.73_m2} (ref >59)
Globulin, Total: 2.7 g/dL (ref 1.5–4.5)
Glucose: 157 mg/dL — ABNORMAL HIGH (ref 65–99)
Potassium: 3.7 mmol/L (ref 3.5–5.2)
SODIUM: 138 mmol/L (ref 134–144)
TOTAL PROTEIN: 6.2 g/dL (ref 6.0–8.5)

## 2018-05-14 LAB — OBSTETRIC PANEL, INCLUDING HIV
Antibody Screen: NEGATIVE
Basophils Absolute: 0 10*3/uL (ref 0.0–0.2)
Basos: 0 %
EOS (ABSOLUTE): 0.1 10*3/uL (ref 0.0–0.4)
EOS: 1 %
HEMATOCRIT: 32.4 % — AB (ref 34.0–46.6)
HEMOGLOBIN: 10.9 g/dL — AB (ref 11.1–15.9)
HIV Screen 4th Generation wRfx: NONREACTIVE
Hepatitis B Surface Ag: NEGATIVE
IMMATURE GRANS (ABS): 0.1 10*3/uL (ref 0.0–0.1)
IMMATURE GRANULOCYTES: 1 %
LYMPHS: 17 %
Lymphocytes Absolute: 2 10*3/uL (ref 0.7–3.1)
MCH: 27.9 pg (ref 26.6–33.0)
MCHC: 33.6 g/dL (ref 31.5–35.7)
MCV: 83 fL (ref 79–97)
MONOCYTES: 8 %
Monocytes Absolute: 0.9 10*3/uL (ref 0.1–0.9)
NEUTROS PCT: 73 %
Neutrophils Absolute: 8.7 10*3/uL — ABNORMAL HIGH (ref 1.4–7.0)
Platelets: 253 10*3/uL (ref 150–450)
RBC: 3.9 x10E6/uL (ref 3.77–5.28)
RDW: 15 % (ref 12.3–15.4)
RH TYPE: POSITIVE
RPR: NONREACTIVE
RUBELLA: 2.89 {index} (ref >0.99)
WBC: 11.8 10*3/uL — ABNORMAL HIGH (ref 3.4–10.8)

## 2018-05-14 LAB — CYTOLOGY - PAP
CHLAMYDIA, DNA PROBE: NEGATIVE
Diagnosis: NEGATIVE
HPV: NOT DETECTED
Neisseria Gonorrhea: NEGATIVE

## 2018-05-14 LAB — HEMOGLOBIN A1C
ESTIMATED AVERAGE GLUCOSE: 163 mg/dL
HEMOGLOBIN A1C: 7.3 % — AB (ref 4.8–5.6)

## 2018-05-14 LAB — HEMOGLOBINOPATHY EVALUATION
HEMOGLOBIN A2 QUANTITATION: 2.2 % (ref 1.8–3.2)
HGB C: 0 %
HGB S: 0 %
HGB VARIANT: 33.6 % — AB
Hemoglobin F Quantitation: 0 % (ref 0.0–2.0)
Hgb A: 64.2 % — ABNORMAL LOW (ref 96.4–98.8)

## 2018-05-14 LAB — TSH: TSH: 1.32 u[IU]/mL (ref 0.450–4.500)

## 2018-05-14 LAB — CYSTIC FIBROSIS MUTATION 97: Interpretation: NOT DETECTED

## 2018-05-14 LAB — URINE CULTURE, OB REFLEX

## 2018-05-14 LAB — CULTURE, OB URINE

## 2018-05-15 ENCOUNTER — Other Ambulatory Visit: Payer: Self-pay

## 2018-05-15 ENCOUNTER — Telehealth: Payer: Self-pay

## 2018-05-15 MED ORDER — AMOXICILLIN-POT CLAVULANATE 500-125 MG PO TABS: 500.0000 mg | ORAL_TABLET | Freq: Two times a day (BID) | ORAL | 0 refills | Status: AC

## 2018-05-15 NOTE — Progress Notes (Signed)
Rx sent per Dr. Alysia PennaErvin.  Patient was notified of results and RX.

## 2018-05-15 NOTE — Telephone Encounter (Signed)
Rx sent to pharmacy per Dr. Alysia PennaErvin.  Patient notified of UTI and Rx. Verbalized understanding.

## 2018-05-15 NOTE — Telephone Encounter (Signed)
-----   Message from Hermina StaggersMichael L Ervin, MD sent at 05/15/2018 10:14 AM EDT ----- Augmentin 500 mg po bid x 7 days for UTI Thanks Casimiro NeedleMichael

## 2018-05-16 ENCOUNTER — Ambulatory Visit: Payer: Medicaid Other

## 2018-05-17 ENCOUNTER — Encounter (HOSPITAL_COMMUNITY): Payer: Self-pay

## 2018-05-24 ENCOUNTER — Ambulatory Visit (HOSPITAL_COMMUNITY): Payer: Medicaid Other | Attending: Obstetrics and Gynecology

## 2018-06-04 ENCOUNTER — Encounter: Payer: Self-pay | Admitting: Obstetrics & Gynecology

## 2018-06-04 ENCOUNTER — Ambulatory Visit (INDEPENDENT_AMBULATORY_CARE_PROVIDER_SITE_OTHER): Payer: Medicaid Other | Admitting: Obstetrics & Gynecology

## 2018-06-04 VITALS — BP 130/69 | HR 98 | Wt 270.0 lb

## 2018-06-04 DIAGNOSIS — O24919 Unspecified diabetes mellitus in pregnancy, unspecified trimester: Secondary | ICD-10-CM | POA: Insufficient documentation

## 2018-06-04 DIAGNOSIS — O0990 Supervision of high risk pregnancy, unspecified, unspecified trimester: Secondary | ICD-10-CM

## 2018-06-04 HISTORY — DX: Unspecified diabetes mellitus in pregnancy, unspecified trimester: O24.919

## 2018-06-04 NOTE — Progress Notes (Signed)
ROB w/ complaints of swelling and abdominal discomfort.

## 2018-06-04 NOTE — Progress Notes (Signed)
   PRENATAL VISIT NOTE  Subjective:  Amy Reynolds is a 37 y.o. [id number] at 4587w4d being seen today for ongoing prenatal care.  She is currently monitored for the following issues for this high-risk pregnancy and has Supervision of high risk pregnancy, antepartum; History of preterm delivery; AMA (advanced maternal age) multigravida 35+; Grand multipara; History of pre-eclampsia; Asthma; History of DVT (deep vein thrombosis); Unwanted fertility; and Diabetes mellitus complicating pregnancy, antepartum on their problem list.  Patient reports no complaints.  Contractions: Irritability. Vag. Bleeding: None.  Movement: Present. Denies leaking of fluid.   The following portions of the patient's history were reviewed and updated as appropriate: allergies, current medications, past family history, past medical history, past social history, past surgical history and problem list. Problem list updated.  Objective:   Vitals:   06/04/18 0946  BP: 130/69  Pulse: 98  Weight: 122.5 kg (270 lb)    Fetal Status: Fetal Heart Rate (bpm): 150   Movement: Present     General:  Alert, oriented and cooperative. Patient is in no acute distress.  Skin: Skin is warm and dry. No rash noted.   Cardiovascular: Normal heart rate noted  Respiratory: Normal respiratory effort, no problems with respiration noted  Abdomen: Soft, gravid, appropriate for gestational age.  Pain/Pressure: Present     Pelvic: Cervical exam deferred        Extremities: Normal range of motion.  Edema: Trace  Mental Status: Normal mood and affect. Normal behavior. Normal judgment and thought content.   Assessment and Plan:  Pregnancy: [id number] at 3187w4d  1. Supervision of high risk pregnancy, antepartum HB A1C 7.3 c/w diabetes - Ambulatory referral to Nutrition and Diabetic Education  2. Diabetes mellitus complicating pregnancy, antepartum Start BG testing ASAP  Preterm labor symptoms and general obstetric precautions including but  not limited to vaginal bleeding, contractions, leaking of fluid and fetal movement were reviewed in detail with the patient. Please refer to After Visit Summary for other counseling recommendations.  Return in about 2 weeks (around 06/18/2018). Needs detailed US  No future appointments.  Scheryl DarterJames Alika Saladin, MD

## 2018-06-04 NOTE — Patient Instructions (Signed)
Type 1 or Type 2 Diabetes Mellitus During Pregnancy, Diagnosis Type 1 diabetes (type 1 diabetes mellitus) and type 2 diabetes (type 2 diabetes mellitus) are long-term (chronic) diseases. Your diabetes may be caused by one or both of these problems:  Your body does not make enough of a hormone called insulin.  Your body does not respond in a normal way to insulin that it makes.  Insulin lets sugars (glucose) go into cells in the body. This gives you energy. If you have diabetes, sugars cannot get into cells. This causes high blood sugar (hyperglycemia). If diabetes is treated, it may not hurt you or your baby. Your doctor will set treatment goals for you. In general, you should have these blood sugar levels:  After not eating for a long time (fasting): 95 mg/dL (5.3 mmol/L).  After meals (postprandial): ? One hour after a meal: at or below 140 mg/dL (7.8 mmol/L). ? Two hours after a meal: at or below 120 mg/dL (6.7 mmol/L).  A1c (hemoglobin A1c) level: 6-6.5%.  Follow these instructions at home: Questions to Ask Your Doctor  You may want to ask these questions:  Do I need to meet with a diabetes educator?  Where can I find a support group for people with diabetes?  What equipment will I need to care for myself at home?  What diabetes medicines do I need? When should I take them?  How often do I need to check my blood sugar?  What number can I call if I have questions?  When is my next doctor's visit?  General instructions  Take over-the-counter and prescription medicines only as told by your doctor.  Stay at a healthy weight during pregnancy.  Keep all follow-up visits as told by your doctor. This is important. Contact a doctor if:  Your blood sugar is at or above 240 mg/dL (13.3 mmol/L).  Your blood sugar is at or above 200 mg/dL (11.1 mmol/L), and you have ketones in your pee (urine).  You have been sick or have had a fever for 2 days or more, and you are not  getting better.  You have any of these problems for more than 6 hours: ? You cannot eat or drink. ? You feel sick to your stomach (nauseous). ? You throw up (vomit). ? You have watery poop (diarrhea). Get help right away if:  Your blood sugar is lower than 54 mg/dL (3 mmol/L).  You get confused.  You have trouble: ? Thinking clearly. ? Breathing.  Your baby moves less than normal.  You have: ? Moderate or large ketone levels in your pee (urine). ? Bleeding from your vagina. ? Unusual fluid coming from your vagina. ? Early contractions. These may feel like tightness in your belly. This information is not intended to replace advice given to you by your health care provider. Make sure you discuss any questions you have with your health care provider. Document Released: 02/14/2016 Document Revised: 09/06/2016 Document Reviewed: 11/26/2015 Elsevier Interactive Patient Education  2017 [employer] Inc[employer]  

## 2018-06-11 ENCOUNTER — Ambulatory Visit (HOSPITAL_COMMUNITY)
Admission: RE | Admit: 2018-06-11 | Discharge: 2018-06-11 | Disposition: A | Discharge status: Home / Self Care | Payer: Medicaid Other | Source: Ambulatory Visit | Attending: Obstetrics and Gynecology | Admitting: Obstetrics and Gynecology

## 2018-06-12 ENCOUNTER — Ambulatory Visit: Payer: Medicaid Other | Admitting: Registered"

## 2018-06-18 ENCOUNTER — Encounter: Payer: Medicaid Other | Admitting: Obstetrics & Gynecology

## 2018-06-18 NOTE — Progress Notes (Deleted)
   Patient did not show up today for her scheduled appointment.   Jolisa Intriago, MD, FACOG Obstetrician & Gynecologist, [facility], [facility]  

## 2018-06-19 ENCOUNTER — Telehealth: Payer: Self-pay | Admitting: *Deleted

## 2018-06-19 NOTE — Telephone Encounter (Signed)
Left vmail for patient to callback and reschedule Missed Ob appt on 06/18/2018.Marland Kitchen..Marland Kitchen

## 2018-07-29 ENCOUNTER — Ambulatory Visit (INDEPENDENT_AMBULATORY_CARE_PROVIDER_SITE_OTHER): Payer: Medicaid Other | Admitting: Obstetrics & Gynecology

## 2018-07-29 VITALS — BP 115/73 | HR 102 | Wt 267.6 lb

## 2018-07-29 DIAGNOSIS — Z86718 Personal history of other venous thrombosis and embolism: Secondary | ICD-10-CM

## 2018-07-29 DIAGNOSIS — O24919 Unspecified diabetes mellitus in pregnancy, unspecified trimester: Secondary | ICD-10-CM

## 2018-07-29 DIAGNOSIS — O0990 Supervision of high risk pregnancy, unspecified, unspecified trimester: Secondary | ICD-10-CM

## 2018-07-29 MED ORDER — ACCU-CHEK GUIDE W/DEVICE KIT: 1.0000 | PACK | Freq: Once | 0 refills | Status: AC

## 2018-07-29 MED ORDER — ACCU-CHEK FASTCLIX LANCETS MISC: 1.0000 | Freq: Four times a day (QID) | 5 refills | Status: DC

## 2018-07-29 MED ORDER — GLUCOSE BLOOD VI STRP: ORAL_STRIP | 5 refills | Status: DC

## 2018-07-29 NOTE — Progress Notes (Signed)
Pt is here for ROB. 

## 2018-07-29 NOTE — Progress Notes (Signed)
   PRENATAL VISIT NOTE  Subjective:  Evlyn KannerMaisha Hannig is a 37 y.o. [id number] at 4190w3d being seen today for ongoing prenatal care.  She is currently monitored for the following issues for this high-risk pregnancy and has Supervision of high risk pregnancy, antepartum; History of preterm delivery; AMA (advanced maternal age) multigravida 35+; Grand multipara; History of pre-eclampsia; Asthma; History of DVT (deep vein thrombosis); Unwanted fertility; and Diabetes mellitus complicating pregnancy, antepartum on their problem list.  Patient reports no complaints.  Contractions: Irritability. Vag. Bleeding: None.  Movement: Present. Denies leaking of fluid.   The following portions of the patient's history were reviewed and updated as appropriate: allergies, current medications, past family history, past medical history, past social history, past surgical history and problem list. Problem list updated.  Objective:   Vitals:   07/29/18 1539  BP: 115/73  Pulse: (!) 102  Weight: 267 lb 9.6 oz (121.4 kg)    Fetal Status: Fetal Heart Rate (bpm): 162   Movement: Present     General:  Alert, oriented and cooperative. Patient is in no acute distress.  Skin: Skin is warm and dry. No rash noted.   Cardiovascular: Normal heart rate noted  Respiratory: Normal respiratory effort, no problems with respiration noted  Abdomen: Soft, gravid, appropriate for gestational age.  Pain/Pressure: Present     Pelvic: Cervical exam deferred        Extremities: Normal range of motion.  Edema: Trace  Mental Status: Normal mood and affect. Normal behavior. Normal judgment and thought content.   Assessment and Plan:  Pregnancy: [id number] at 3790w3d  1. Supervision of high risk pregnancy, antepartum Missed appt for US and diabetic teaching and needs to reschedule  2. History of DVT (deep vein thrombosis) Lovenox  3. Diabetes mellitus complicating pregnancy, antepartum Diabetic education return asap with results of BG  testing  Preterm labor symptoms and general obstetric precautions including but not limited to vaginal bleeding, contractions, leaking of fluid and fetal movement were reviewed in detail with the patient. Please refer to After Visit Summary for other counseling recommendations.  Return in about 1 week (around 08/05/2018).  No future appointments.  Scheryl DarterJames Siegfried Vieth, MD

## 2018-07-29 NOTE — Patient Instructions (Signed)
Type 1 or Type 2 Diabetes Mellitus During Pregnancy, Diagnosis Type 1 diabetes (type 1 diabetes mellitus) and type 2 diabetes (type 2 diabetes mellitus) are long-term (chronic) diseases. Your diabetes may be caused by one or both of these problems:  Your body does not make enough of a hormone called insulin.  Your body does not respond in a normal way to insulin that it makes.  Insulin lets sugars (glucose) go into cells in the body. This gives you energy. If you have diabetes, sugars cannot get into cells. This causes high blood sugar (hyperglycemia). If diabetes is treated, it may not hurt you or your baby. Your doctor will set treatment goals for you. In general, you should have these blood sugar levels:  After not eating for a long time (fasting): 95 mg/dL (5.3 mmol/L).  After meals (postprandial): ? One hour after a meal: at or below 140 mg/dL (7.8 mmol/L). ? Two hours after a meal: at or below 120 mg/dL (6.7 mmol/L).  A1c (hemoglobin A1c) level: 6-6.5%.  Follow these instructions at home: Questions to Ask Your Doctor  You may want to ask these questions:  Do I need to meet with a diabetes educator?  Where can I find a support group for people with diabetes?  What equipment will I need to care for myself at home?  What diabetes medicines do I need? When should I take them?  How often do I need to check my blood sugar?  What number can I call if I have questions?  When is my next doctor's visit?  General instructions  Take over-the-counter and prescription medicines only as told by your doctor.  Stay at a healthy weight during pregnancy.  Keep all follow-up visits as told by your doctor. This is important. Contact a doctor if:  Your blood sugar is at or above 240 mg/dL (13.3 mmol/L).  Your blood sugar is at or above 200 mg/dL (11.1 mmol/L), and you have ketones in your pee (urine).  You have been sick or have had a fever for 2 days or more, and you are not  getting better.  You have any of these problems for more than 6 hours: ? You cannot eat or drink. ? You feel sick to your stomach (nauseous). ? You throw up (vomit). ? You have watery poop (diarrhea). Get help right away if:  Your blood sugar is lower than 54 mg/dL (3 mmol/L).  You get confused.  You have trouble: ? Thinking clearly. ? Breathing.  Your baby moves less than normal.  You have: ? Moderate or large ketone levels in your pee (urine). ? Bleeding from your vagina. ? Unusual fluid coming from your vagina. ? Early contractions. These may feel like tightness in your belly. This information is not intended to replace advice given to you by your health care provider. Make sure you discuss any questions you have with your health care provider. Document Released: 02/14/2016 Document Revised: 09/06/2016 Document Reviewed: 11/26/2015 Elsevier Interactive Patient Education  2017 [employer] Inc[employer]  

## 2018-07-30 ENCOUNTER — Ambulatory Visit: Payer: Medicaid Other | Admitting: Skilled Nursing Facility1

## 2018-07-31 ENCOUNTER — Encounter (HOSPITAL_COMMUNITY): Payer: Self-pay

## 2018-08-07 ENCOUNTER — Encounter: Payer: Medicaid Other | Admitting: Obstetrics & Gynecology

## 2018-08-07 ENCOUNTER — Other Ambulatory Visit: Payer: Self-pay | Admitting: Obstetrics and Gynecology

## 2018-08-07 ENCOUNTER — Ambulatory Visit (HOSPITAL_COMMUNITY)
Admission: RE | Admit: 2018-08-07 | Discharge: 2018-08-07 | Disposition: A | Discharge status: Home / Self Care | Payer: Medicaid Other | Source: Ambulatory Visit | Attending: Obstetrics and Gynecology | Admitting: Obstetrics and Gynecology

## 2018-08-07 ENCOUNTER — Encounter (HOSPITAL_COMMUNITY): Payer: Self-pay

## 2018-08-07 DIAGNOSIS — Z79899 Other long term (current) drug therapy: Secondary | ICD-10-CM | POA: Insufficient documentation

## 2018-08-07 DIAGNOSIS — Z363 Encounter for antenatal screening for malformations: Secondary | ICD-10-CM

## 2018-08-07 DIAGNOSIS — O99513 Diseases of the respiratory system complicating pregnancy, third trimester: Secondary | ICD-10-CM | POA: Diagnosis not present

## 2018-08-07 DIAGNOSIS — O0990 Supervision of high risk pregnancy, unspecified, unspecified trimester: Secondary | ICD-10-CM

## 2018-08-07 DIAGNOSIS — O24113 Pre-existing diabetes mellitus, type 2, in pregnancy, third trimester: Secondary | ICD-10-CM | POA: Insufficient documentation

## 2018-08-07 DIAGNOSIS — J45909 Unspecified asthma, uncomplicated: Secondary | ICD-10-CM | POA: Insufficient documentation

## 2018-08-07 DIAGNOSIS — O09293 Supervision of pregnancy with other poor reproductive or obstetric history, third trimester: Secondary | ICD-10-CM

## 2018-08-07 DIAGNOSIS — O09523 Supervision of elderly multigravida, third trimester: Secondary | ICD-10-CM

## 2018-08-07 DIAGNOSIS — Z3A34 34 weeks gestation of pregnancy: Secondary | ICD-10-CM | POA: Diagnosis not present

## 2018-08-07 DIAGNOSIS — O0993 Supervision of high risk pregnancy, unspecified, third trimester: Secondary | ICD-10-CM | POA: Insufficient documentation

## 2018-08-08 ENCOUNTER — Other Ambulatory Visit (HOSPITAL_COMMUNITY): Payer: Self-pay | Admitting: *Deleted

## 2018-08-08 ENCOUNTER — Encounter: Payer: Medicaid Other | Admitting: Certified Nurse Midwife

## 2018-08-08 ENCOUNTER — Telehealth: Payer: Self-pay | Admitting: Certified Nurse Midwife

## 2018-08-08 DIAGNOSIS — O24113 Pre-existing diabetes mellitus, type 2, in pregnancy, third trimester: Secondary | ICD-10-CM

## 2018-08-08 NOTE — Telephone Encounter (Signed)
PT called to cancel r/s today's appt. Pt also has not kept nurition appts. Please advise. Next appt available here 08/15/18.

## 2018-08-12 ENCOUNTER — Ambulatory Visit: Payer: Medicaid Other | Admitting: Skilled Nursing Facility1

## 2018-08-14 ENCOUNTER — Ambulatory Visit (HOSPITAL_COMMUNITY): Payer: Medicaid Other

## 2018-08-14 ENCOUNTER — Ambulatory Visit (HOSPITAL_COMMUNITY): Admission: RE | Admit: 2018-08-14 | Payer: Medicaid Other | Source: Ambulatory Visit

## 2018-08-15 ENCOUNTER — Encounter: Payer: Medicaid Other | Admitting: Certified Nurse Midwife

## 2018-08-19 ENCOUNTER — Telehealth: Payer: Self-pay

## 2018-08-19 NOTE — Telephone Encounter (Signed)
TC from pt reporting excessive swelling, chest discomfort and rapid heartbeat. Pt states she had nausea and vomiting yesterday. Notes + FM and may have possible lost mucous plug.  Pt advised to to go to [facility] for evaluation.

## 2018-08-22 ENCOUNTER — Encounter: Payer: Self-pay | Admitting: Obstetrics and Gynecology

## 2018-08-22 ENCOUNTER — Other Ambulatory Visit (HOSPITAL_COMMUNITY)
Admission: RE | Admit: 2018-08-22 | Discharge: 2018-08-22 | Disposition: A | Discharge status: Home / Self Care | Payer: Medicaid Other | Source: Ambulatory Visit | Attending: Certified Nurse Midwife | Admitting: Certified Nurse Midwife

## 2018-08-22 ENCOUNTER — Ambulatory Visit (INDEPENDENT_AMBULATORY_CARE_PROVIDER_SITE_OTHER): Payer: Medicaid Other | Admitting: Obstetrics and Gynecology

## 2018-08-22 VITALS — BP 125/84 | HR 82 | Wt 272.2 lb

## 2018-08-22 DIAGNOSIS — Z8759 Personal history of other complications of pregnancy, childbirth and the puerperium: Secondary | ICD-10-CM

## 2018-08-22 DIAGNOSIS — O0990 Supervision of high risk pregnancy, unspecified, unspecified trimester: Secondary | ICD-10-CM | POA: Diagnosis present

## 2018-08-22 DIAGNOSIS — Z3009 Encounter for other general counseling and advice on contraception: Secondary | ICD-10-CM

## 2018-08-22 DIAGNOSIS — O24919 Unspecified diabetes mellitus in pregnancy, unspecified trimester: Secondary | ICD-10-CM

## 2018-08-22 DIAGNOSIS — Z641 Problems related to multiparity: Secondary | ICD-10-CM

## 2018-08-22 DIAGNOSIS — Z86718 Personal history of other venous thrombosis and embolism: Secondary | ICD-10-CM

## 2018-08-22 DIAGNOSIS — O0993 Supervision of high risk pregnancy, unspecified, third trimester: Secondary | ICD-10-CM | POA: Diagnosis not present

## 2018-08-22 DIAGNOSIS — O09523 Supervision of elderly multigravida, third trimester: Secondary | ICD-10-CM

## 2018-08-22 DIAGNOSIS — Z8751 Personal history of pre-term labor: Secondary | ICD-10-CM

## 2018-08-22 NOTE — Progress Notes (Signed)
PRENATAL VISIT NOTE  Subjective:  Amy Reynolds is a 37 y.o. [id number] at [redacted]w[redacted]d being seen today for ongoing prenatal care.  She is currently monitored for the following issues for this high-risk pregnancy and has Supervision of high risk pregnancy, antepartum; History of preterm delivery; AMA (advanced maternal age) multigravida 35+; Grand multipara; History of pre-eclampsia; Asthma; History of DVT (deep vein thrombosis); Unwanted fertility; and Diabetes mellitus complicating pregnancy, antepartum on their problem list.  Patient reports worsening contractions.  Contractions: Irritability. Vag. Bleeding: None.  Movement: Present. Denies leaking of fluid. Also with some nose bleeding.   The following portions of the patient's history were reviewed and updated as appropriate: allergies, current medications, past family history, past medical history, past social history, past surgical history and problem list. Problem list updated.  Objective:   Vitals:   08/22/18 1003 08/22/18 1006  BP: (!) 132/92 125/84  Pulse: 88 82  Weight: 272 lb 3.2 oz (123.5 kg)     Fetal Status: Fetal Heart Rate (bpm): 155   Movement: Present     General:  Alert, oriented and cooperative. Patient is in no acute distress.  Skin: Skin is warm and dry. No rash noted.   Cardiovascular: Normal heart rate noted  Respiratory: Normal respiratory effort, no problems with respiration noted  Abdomen: Soft, gravid, appropriate for gestational age.  Pain/Pressure: Present     Pelvic: Cervical exam deferred        Extremities: Normal range of motion.  Edema: Mild pitting, slight indentation  Mental Status: Normal mood and affect. Normal behavior. Normal judgment and thought content.   Assessment and Plan:  Pregnancy: [id number] at [redacted]w[redacted]d  1. Supervision of high risk pregnancy, antepartum  2. Diabetes mellitus complicating pregnancy, antepartum Not on medication, did not bring log Patient non-compliant with BPP and  appointments She states she is checking her sugars but cannot remember any values. She checks her morning sugars but does not check any sugars after eating. She usually talks to her aunt, who instructs her to eat more.  - Had long discussion with patient regarding risks of diabetes, particularly uncontrolled diabetes, implications for fetus and for pregnancy, including possible macrosomia, neonatal hypoglycemia, seizures, possible fetal demise. Reviewed that without CBGs, we have no way or knowing how well controlled her DM is and whether she needs intervention. Reviewed need for regular NST/BPP, emphasized importance of regular testing in the absence of any other way to monitor fetus. She declines NST today but states she will agree to return tomorrow for NST. She does not want any further [place] because "they couldn't see anything anyway" but I reviewed the importance of knowing about fetal size and wellbeing and she is agreeable to go to [place] scheduled for her.   3. Multigravida of advanced maternal age in third trimester  4. History of preterm delivery  5. History of pre-eclampsia  6. History of DVT (deep vein thrombosis) On lovenox, states she is compliant  7. Grand multipara  8. Unwanted fertility For BTL  Preterm labor symptoms and general obstetric precautions including but not limited to vaginal bleeding, contractions, leaking of fluid and fetal movement were reviewed in detail with the patient. Please refer to After Visit Summary for other counseling recommendations.  Return in about 1 week (around 08/29/2018) for OB visit (MD).  Future Appointments  Date Time Provider Department Center  08/23/2018 10:30 AM [facility] NURSE [facility] None  08/23/2018  2:30 PM [facility] [place] 5 [facility]  08/27/2018 11:00 AM Scotece, Jon Gills  B, RD [facility] NDM    Conan Bowens, MD

## 2018-08-22 NOTE — Progress Notes (Signed)
Pt presents for HOB.

## 2018-08-23 ENCOUNTER — Ambulatory Visit (HOSPITAL_COMMUNITY)
Admission: RE | Admit: 2018-08-23 | Discharge: 2018-08-23 | Disposition: A | Discharge status: Home / Self Care | Payer: Medicaid Other | Source: Ambulatory Visit | Attending: Maternal and Fetal Medicine | Admitting: Maternal and Fetal Medicine

## 2018-08-23 ENCOUNTER — Other Ambulatory Visit: Payer: Medicaid Other

## 2018-08-23 ENCOUNTER — Encounter (HOSPITAL_COMMUNITY): Payer: Self-pay

## 2018-08-23 LAB — CERVICOVAGINAL ANCILLARY ONLY
BACTERIAL VAGINITIS: NEGATIVE
CANDIDA VAGINITIS: NEGATIVE
TRICH (WINDOWPATH): NEGATIVE

## 2018-08-24 LAB — STREP GP B NAA: STREP GROUP B AG: POSITIVE — AB

## 2018-08-27 ENCOUNTER — Ambulatory Visit: Payer: Medicaid Other | Admitting: Skilled Nursing Facility1

## 2018-09-02 ENCOUNTER — Ambulatory Visit (INDEPENDENT_AMBULATORY_CARE_PROVIDER_SITE_OTHER): Payer: Medicaid Other | Admitting: Obstetrics

## 2018-09-02 ENCOUNTER — Encounter: Payer: Self-pay | Admitting: Obstetrics

## 2018-09-02 VITALS — BP 139/92 | HR 89 | Wt 268.0 lb

## 2018-09-02 DIAGNOSIS — Z641 Problems related to multiparity: Secondary | ICD-10-CM

## 2018-09-02 DIAGNOSIS — Z8751 Personal history of pre-term labor: Secondary | ICD-10-CM

## 2018-09-02 DIAGNOSIS — O0990 Supervision of high risk pregnancy, unspecified, unspecified trimester: Secondary | ICD-10-CM

## 2018-09-02 DIAGNOSIS — Z8759 Personal history of other complications of pregnancy, childbirth and the puerperium: Secondary | ICD-10-CM

## 2018-09-02 DIAGNOSIS — O09523 Supervision of elderly multigravida, third trimester: Secondary | ICD-10-CM

## 2018-09-02 DIAGNOSIS — Z9119 Patient's noncompliance with other medical treatment and regimen: Secondary | ICD-10-CM

## 2018-09-02 DIAGNOSIS — Z91199 Patient's noncompliance with other medical treatment and regimen due to unspecified reason: Secondary | ICD-10-CM

## 2018-09-02 DIAGNOSIS — Z86718 Personal history of other venous thrombosis and embolism: Secondary | ICD-10-CM

## 2018-09-02 NOTE — Progress Notes (Signed)
Pt complains of contractions, swelling  NST TODAY  Per last notes pt was to return to office for NST.   Pt request cervix check.

## 2018-09-02 NOTE — Progress Notes (Signed)
Subjective:  Amy Reynolds is a 37 y.o. [id number] at [redacted]w[redacted]d being seen today for ongoing prenatal care.  She is currently monitored for the following issues for this high-risk pregnancy and has Supervision of high risk pregnancy, antepartum; History of preterm delivery; AMA (advanced maternal age) multigravida 35+; Grand multipara; History of pre-eclampsia; Asthma; History of DVT (deep vein thrombosis); Unwanted fertility; and Diabetes mellitus complicating pregnancy, antepartum on their problem list.  Patient reports occasional contractions.  Contractions: Irregular. Vag. Bleeding: None.  Movement: Present. Denies leaking of fluid.   The following portions of the patient's history were reviewed and updated as appropriate: allergies, current medications, past family history, past medical history, past social history, past surgical history and problem list. Problem list updated.  Objective:   Vitals:   09/02/18 1103  BP: (!) 139/92  Pulse: 89  Weight: 268 lb (121.6 kg)    Fetal Status: Fetal Heart Rate (bpm): NST-R   Movement: Present     General:  Alert, oriented and cooperative. Patient is in no acute distress.  Skin: Skin is warm and dry. No rash noted.   Cardiovascular: Normal heart rate noted  Respiratory: Normal respiratory effort, no problems with respiration noted  Abdomen: Soft, gravid, appropriate for gestational age. Pain/Pressure: Absent     Pelvic:  Cervical exam performed      Cvx:  2cm / 50% / -3 / Vtx  Extremities: Normal range of motion.  Edema: Mild pitting, slight indentation  Mental Status: Normal mood and affect. Normal behavior. Normal judgment and thought content.   Urinalysis:      Assessment and Plan:  Pregnancy: [id number] at [redacted]w[redacted]d  1. Supervision of high risk pregnancy, antepartum  2. Multigravida of advanced maternal age in third trimester  3. Grand multipara  4. History of preterm delivery  5. History of pre-eclampsia  6. History of DVT (deep vein  thrombosis)  7. General patient noncompliance   Term labor symptoms and general obstetric precautions including but not limited to vaginal bleeding, contractions, leaking of fluid and fetal movement were reviewed in detail with the patient. Please refer to After Visit Summary for other counseling recommendations.   IOL scheduled for 09-04-2018 for GDM, noncompliance with management.   Brock Bad, MD

## 2018-09-03 ENCOUNTER — Telehealth (HOSPITAL_COMMUNITY): Payer: Self-pay | Admitting: *Deleted

## 2018-09-03 NOTE — Telephone Encounter (Signed)
Preadmission screen  

## 2018-09-04 ENCOUNTER — Encounter (HOSPITAL_COMMUNITY): Payer: Self-pay

## 2018-09-04 ENCOUNTER — Inpatient Hospital Stay (HOSPITAL_COMMUNITY): Payer: Medicaid Other | Admitting: Anesthesiology

## 2018-09-04 ENCOUNTER — Ambulatory Visit (HOSPITAL_COMMUNITY): Payer: Medicaid Other

## 2018-09-04 ENCOUNTER — Inpatient Hospital Stay (HOSPITAL_COMMUNITY)
Admission: RE | Admit: 2018-09-04 | Discharge: 2018-09-07 | DRG: 807 | Disposition: A | Discharge status: Home / Self Care | Payer: Medicaid Other | Attending: Family Medicine | Admitting: Family Medicine

## 2018-09-04 ENCOUNTER — Other Ambulatory Visit: Payer: Self-pay

## 2018-09-04 DIAGNOSIS — Z8759 Personal history of other complications of pregnancy, childbirth and the puerperium: Secondary | ICD-10-CM

## 2018-09-04 DIAGNOSIS — O09529 Supervision of elderly multigravida, unspecified trimester: Secondary | ICD-10-CM

## 2018-09-04 DIAGNOSIS — Z641 Problems related to multiparity: Secondary | ICD-10-CM

## 2018-09-04 DIAGNOSIS — J45909 Unspecified asthma, uncomplicated: Secondary | ICD-10-CM | POA: Diagnosis present

## 2018-09-04 DIAGNOSIS — O9952 Diseases of the respiratory system complicating childbirth: Secondary | ICD-10-CM | POA: Diagnosis present

## 2018-09-04 DIAGNOSIS — O24919 Unspecified diabetes mellitus in pregnancy, unspecified trimester: Secondary | ICD-10-CM | POA: Diagnosis present

## 2018-09-04 DIAGNOSIS — Z86718 Personal history of other venous thrombosis and embolism: Secondary | ICD-10-CM | POA: Diagnosis not present

## 2018-09-04 DIAGNOSIS — O134 Gestational [pregnancy-induced] hypertension without significant proteinuria, complicating childbirth: Secondary | ICD-10-CM | POA: Diagnosis present

## 2018-09-04 DIAGNOSIS — Z3009 Encounter for other general counseling and advice on contraception: Secondary | ICD-10-CM | POA: Diagnosis present

## 2018-09-04 DIAGNOSIS — O24429 Gestational diabetes mellitus in childbirth, unspecified control: Secondary | ICD-10-CM | POA: Diagnosis present

## 2018-09-04 DIAGNOSIS — O99824 Streptococcus B carrier state complicating childbirth: Secondary | ICD-10-CM | POA: Diagnosis not present

## 2018-09-04 DIAGNOSIS — Z3A38 38 weeks gestation of pregnancy: Secondary | ICD-10-CM | POA: Diagnosis not present

## 2018-09-04 DIAGNOSIS — O99214 Obesity complicating childbirth: Secondary | ICD-10-CM | POA: Diagnosis present

## 2018-09-04 DIAGNOSIS — O139 Gestational [pregnancy-induced] hypertension without significant proteinuria, unspecified trimester: Secondary | ICD-10-CM | POA: Diagnosis present

## 2018-09-04 DIAGNOSIS — O0990 Supervision of high risk pregnancy, unspecified, unspecified trimester: Secondary | ICD-10-CM

## 2018-09-04 DIAGNOSIS — Z8751 Personal history of pre-term labor: Secondary | ICD-10-CM

## 2018-09-04 HISTORY — DX: Gestational (pregnancy-induced) hypertension without significant proteinuria, unspecified trimester: O13.9

## 2018-09-04 LAB — COMPREHENSIVE METABOLIC PANEL
ALBUMIN: 2.6 g/dL — AB (ref 3.5–5.0)
ALK PHOS: 141 U/L — AB (ref 38–126)
ALT: 11 U/L (ref 0–44)
AST: 18 U/L (ref 15–41)
Anion gap: 9 (ref 5–15)
CALCIUM: 8.7 mg/dL — AB (ref 8.9–10.3)
CO2: 22 mmol/L (ref 22–32)
CREATININE: 0.61 mg/dL (ref 0.44–1.00)
Chloride: 104 mmol/L (ref 98–111)
GFR calc non Af Amer: >60 mL/min (ref >60)
GLUCOSE: 89 mg/dL (ref 70–99)
Potassium: 3.9 mmol/L (ref 3.5–5.1)
Sodium: 135 mmol/L (ref 135–145)
Total Bilirubin: 0.7 mg/dL (ref 0.3–1.2)
Total Protein: 6.4 g/dL — ABNORMAL LOW (ref 6.5–8.1)

## 2018-09-04 LAB — CBC
HEMATOCRIT: 26.9 % — AB (ref 36.0–46.0)
HEMOGLOBIN: 9 g/dL — AB (ref 12.0–15.0)
MCH: 27.6 pg (ref 26.0–34.0)
MCHC: 33.5 g/dL (ref 30.0–36.0)
MCV: 82.5 fL (ref 80.0–100.0)
Platelets: 263 10*3/uL (ref 150–400)
RBC: 3.26 MIL/uL — AB (ref 3.87–5.11)
RDW: 13.3 % (ref 11.5–15.5)
WBC: 11 10*3/uL — ABNORMAL HIGH (ref 4.0–10.5)

## 2018-09-04 LAB — GLUCOSE, CAPILLARY
Glucose-Capillary: 102 mg/dL — ABNORMAL HIGH (ref 70–99)
Glucose-Capillary: 81 mg/dL (ref 70–99)

## 2018-09-04 LAB — PROTEIN / CREATININE RATIO, URINE
Creatinine, Urine: 213 mg/dL
Protein Creatinine Ratio: 0.14 mg/mg{Cre} (ref 0.00–0.15)
Total Protein, Urine: 30 mg/dL

## 2018-09-04 LAB — PREPARE RBC (CROSSMATCH)

## 2018-09-04 LAB — ABO/RH: ABO/RH(D): O POS

## 2018-09-04 MED ORDER — OXYTOCIN 40 UNITS IN LACTATED RINGERS INFUSION - SIMPLE MED: 10.0000 [IU]/h | INTRAVENOUS | Status: DC

## 2018-09-04 MED ORDER — FENTANYL CITRATE (PF) 100 MCG/2ML IJ SOLN
100.0000 ug | INTRAMUSCULAR | Status: DC | PRN
  Administered 2018-09-04: 100 ug via INTRAVENOUS
  Filled 2018-09-04: qty 2

## 2018-09-04 MED ORDER — TERBUTALINE SULFATE 1 MG/ML IJ SOLN
0.2500 mg | Freq: Once | INTRAMUSCULAR | Status: DC | PRN
  Filled 2018-09-04: qty 1

## 2018-09-04 MED ORDER — FENTANYL 2.5 MCG/ML BUPIVACAINE 1/10 % EPIDURAL INFUSION (WH - ANES)
14.0000 mL/h | INTRAMUSCULAR | Status: DC | PRN
  Administered 2018-09-04 (×2): 14 mL/h via EPIDURAL
  Filled 2018-09-04 (×2): qty 100

## 2018-09-04 MED ORDER — BUPIVACAINE HCL (PF) 0.25 % IJ SOLN
INTRAMUSCULAR | Status: DC | PRN
  Administered 2018-09-04: 8 mL via EPIDURAL

## 2018-09-04 MED ORDER — LIDOCAINE HCL (PF) 1 % IJ SOLN
INTRAMUSCULAR | Status: DC | PRN
  Administered 2018-09-04: 13 mL via EPIDURAL

## 2018-09-04 MED ORDER — LACTATED RINGERS IV SOLN
500.0000 mL | INTRAVENOUS | Status: DC | PRN
  Administered 2018-09-04 (×2): 500 mL via INTRAVENOUS

## 2018-09-04 MED ORDER — OXYTOCIN 40 UNITS IN LACTATED RINGERS INFUSION - SIMPLE MED
1.0000 m[IU]/min | INTRAVENOUS | Status: DC
  Administered 2018-09-04: 2 m[IU]/min via INTRAVENOUS
  Filled 2018-09-04: qty 1000

## 2018-09-04 MED ORDER — TRANEXAMIC ACID-NACL 1000-0.7 MG/100ML-% IV SOLN
1000.0000 mg | INTRAVENOUS | Status: AC
  Administered 2018-09-05: 1000 mg via INTRAVENOUS
  Filled 2018-09-04: qty 100

## 2018-09-04 MED ORDER — PHENYLEPHRINE 40 MCG/ML (10ML) SYRINGE FOR IV PUSH (FOR BLOOD PRESSURE SUPPORT)
80.0000 ug | PREFILLED_SYRINGE | INTRAVENOUS | Status: DC | PRN
  Filled 2018-09-04: qty 5

## 2018-09-04 MED ORDER — EPHEDRINE 5 MG/ML INJ
10.0000 mg | INTRAVENOUS | Status: DC | PRN
  Filled 2018-09-04: qty 2

## 2018-09-04 MED ORDER — OXYCODONE-ACETAMINOPHEN 5-325 MG PO TABS: 1.0000 | ORAL_TABLET | ORAL | Status: DC | PRN

## 2018-09-04 MED ORDER — PENICILLIN G 3 MILLION UNITS IVPB - SIMPLE MED
3.0000 10*6.[IU] | INTRAVENOUS | Status: DC
  Administered 2018-09-04 (×3): 3 10*6.[IU] via INTRAVENOUS
  Filled 2018-09-04 (×5): qty 100

## 2018-09-04 MED ORDER — CARBOPROST TROMETHAMINE 250 MCG/ML IM SOLN: 250.0000 ug | INTRAMUSCULAR | Status: DC | PRN

## 2018-09-04 MED ORDER — SOD CITRATE-CITRIC ACID 500-334 MG/5ML PO SOLN: 30.0000 mL | ORAL | Status: DC | PRN

## 2018-09-04 MED ORDER — DIPHENHYDRAMINE HCL 50 MG/ML IJ SOLN: 12.5000 mg | INTRAMUSCULAR | Status: DC | PRN

## 2018-09-04 MED ORDER — LACTATED RINGERS IV SOLN
INTRAVENOUS | Status: DC
  Administered 2018-09-04 (×3): via INTRAVENOUS

## 2018-09-04 MED ORDER — MISOPROSTOL 200 MCG PO TABS
ORAL_TABLET | ORAL | Status: AC
  Administered 2018-09-05: 10000 ug via RECTAL
  Filled 2018-09-04: qty 5

## 2018-09-04 MED ORDER — ONDANSETRON HCL 4 MG/2ML IJ SOLN
4.0000 mg | Freq: Four times a day (QID) | INTRAMUSCULAR | Status: DC | PRN
  Administered 2018-09-04: 4 mg via INTRAVENOUS
  Filled 2018-09-04: qty 2

## 2018-09-04 MED ORDER — ACETAMINOPHEN 325 MG PO TABS: 650.0000 mg | ORAL_TABLET | ORAL | Status: DC | PRN

## 2018-09-04 MED ORDER — LACTATED RINGERS IV SOLN
500.0000 mL | Freq: Once | INTRAVENOUS | Status: AC
  Administered 2018-09-04: 500 mL via INTRAVENOUS

## 2018-09-04 MED ORDER — OXYTOCIN BOLUS FROM INFUSION
500.0000 mL | Freq: Once | INTRAVENOUS | Status: AC
  Administered 2018-09-05: 500 mL via INTRAVENOUS

## 2018-09-04 MED ORDER — OXYCODONE-ACETAMINOPHEN 5-325 MG PO TABS
2.0000 | ORAL_TABLET | ORAL | Status: DC | PRN
  Administered 2018-09-05: 2 via ORAL
  Filled 2018-09-04: qty 2

## 2018-09-04 MED ORDER — MISOPROSTOL 50MCG HALF TABLET
50.0000 ug | ORAL_TABLET | ORAL | Status: DC | PRN
  Administered 2018-09-04 (×2): 50 ug via ORAL
  Filled 2018-09-04 (×3): qty 1

## 2018-09-04 MED ORDER — PHENYLEPHRINE 40 MCG/ML (10ML) SYRINGE FOR IV PUSH (FOR BLOOD PRESSURE SUPPORT)
80.0000 ug | PREFILLED_SYRINGE | INTRAVENOUS | Status: DC | PRN
  Filled 2018-09-04: qty 10
  Filled 2018-09-04: qty 5

## 2018-09-04 MED ORDER — METHYLERGONOVINE MALEATE 0.2 MG/ML IJ SOLN: 0.2000 mg | Freq: Once | INTRAMUSCULAR | Status: DC

## 2018-09-04 MED ORDER — LIDOCAINE HCL (PF) 1 % IJ SOLN
30.0000 mL | INTRAMUSCULAR | Status: DC | PRN
  Filled 2018-09-04: qty 30

## 2018-09-04 MED ORDER — SODIUM CHLORIDE 0.9 % IV SOLN
5.0000 10*6.[IU] | Freq: Once | INTRAVENOUS | Status: AC
  Administered 2018-09-04: 5 10*6.[IU] via INTRAVENOUS
  Filled 2018-09-04: qty 5

## 2018-09-04 MED ORDER — MISOPROSTOL 200 MCG PO TABS: 800.0000 ug | ORAL_TABLET | Freq: Once | ORAL | Status: DC

## 2018-09-04 MED ORDER — OXYTOCIN 40 UNITS IN LACTATED RINGERS INFUSION - SIMPLE MED
2.5000 [IU]/h | INTRAVENOUS | Status: DC
  Administered 2018-09-05: 2.5 [IU]/h via INTRAVENOUS

## 2018-09-04 NOTE — Progress Notes (Signed)
LABOR PROGRESS NOTE  Amy Reynolds is a 37 y.o. [id number] at [redacted]w[redacted]d  admitted for IOL for GDM with unknown control.   Subjective: Doing well, feeling some cramping.   Objective: BP 128/61   Pulse 86   Temp 97.9 F (36.6 C) (Oral)   Resp 15   Ht 5\' 2"  (1.575 m)   Wt 121.6 kg   SpO2 99%   BMI 49.02 kg/m  or  Vitals:   09/04/18 1702 09/04/18 1732 09/04/18 1802 09/04/18 1832  BP: (!) 143/85 129/87 123/65 128/61  Pulse: 87 85 82 86  Resp: 16 15 19 15   Temp:      TempSrc:      SpO2:      Weight:      Height:        Dilation: 5 Effacement (%): Thick Cervical Position: Posterior Station: -3 Presentation: Vertex Exam by:: Devra Dopp, RN  FHT: baseline rate 160, moderate varibility, +acel, occasional variable decel Toco: every 1-3 min   Labs: Lab Results  Component Value Date   WBC 11.0 (H) 09/04/2018   HGB 9.0 (L) 09/04/2018   HCT 26.9 (L) 09/04/2018   MCV 82.5 09/04/2018   PLT 263 09/04/2018    Patient Active Problem List   Diagnosis Date Noted  . PIH (pregnancy induced hypertension) 09/04/2018  . Diabetes mellitus complicating pregnancy, antepartum 06/04/2018  . Supervision of high risk pregnancy, antepartum 05/07/2018  . History of preterm delivery 05/07/2018  . AMA (advanced maternal age) multigravida 35+ 05/07/2018  . Grand multipara 05/07/2018  . History of pre-eclampsia 05/07/2018  . Asthma 05/07/2018  . History of DVT (deep vein thrombosis) 05/07/2018  . Unwanted fertility 05/07/2018    Assessment / Plan: 37 y.o. [id number] at [redacted]w[redacted]d here for IOL due to GDM.   Labor: IOL, placed FB around 1500 with cyto x1 at that time- out 1630 with cervical change. Will monitor closely, consider starting pit around 730 with +/- AROM depending on exam.  Fetal Wellbeing:  Cat 1 strip currently- monitor closely. Occasional cat 2 with variables, position changes with improvement.  Pain Control:  Epidural  Anticipated MOD: NSVD   1. GDM: stable, last glucose 102.    -Q4 CBGs during latent, q2 during active   2. Pregnancy induced hypertension: Mild range elevated BP x2 during admission >4 hours apart, no previous history of HTN during this pregnancy. Asymptomatic. Plts wnl.   -Obtain CMP, UPC   -Monitor BP   Leticia Penna, D.O. Family medicine PGY-1  09/04/2018, 6:52 PM

## 2018-09-04 NOTE — Anesthesia Procedure Notes (Signed)
Epidural Patient location during procedure: OB Start time: 09/04/2018 11:54 AM End time: 09/04/2018 12:05 PM  Staffing Anesthesiologist: Lowella Curb, MD Performed: anesthesiologist   Preanesthetic Checklist Completed: patient identified, site marked, surgical consent, pre-op evaluation, timeout performed, IV checked, risks and benefits discussed and monitors and equipment checked  Epidural Patient position: sitting Prep: ChloraPrep Patient monitoring: heart rate, cardiac monitor, continuous pulse ox and blood pressure Approach: midline Location: L2-L3 Injection technique: LOR saline  Needle:  Needle type: Tuohy  Needle gauge: 17 G Needle length: 9 cm Needle insertion depth: 7 cm Catheter type: closed end flexible Catheter size: 20 Guage Catheter at skin depth: 13 cm Test dose: negative  Assessment Events: blood not aspirated, injection not painful, no injection resistance, negative IV test and no paresthesia  Additional Notes Reason for block:procedure for pain

## 2018-09-04 NOTE — Anesthesia Pain Management Evaluation Note (Signed)
  CRNA Pain Management Visit Note  Patient: Amy Reynolds, 37 y.o., female  "Hello I am a member of the anesthesia team at [facility]. We have an anesthesia team available at all times to provide care throughout the hospital, including epidural management and anesthesia for C-section. I don't know your plan for the delivery whether it a natural birth, water birth, IV sedation, nitrous supplementation, doula or epidural, but we want to meet your pain goals."   1.Was your pain managed to your expectations on prior hospitalizations?   Yes   2.What is your expectation for pain management during this hospitalization?     Epidural  3.How can we help you reach that goal? Epidural in place and is working well  Record the patient's initial score and the patient's pain goal.   Pain: 2 (pressure}  Pain Goal: 5 [facility] wants you to be able to say your pain was always managed very well.  Otila Starn 09/04/2018

## 2018-09-04 NOTE — H&P (Addendum)
OBSTETRIC ADMISSION HISTORY AND PHYSICAL  Amy Reynolds is a 37 y.o. female [id number] with IUP at [redacted]w[redacted]d by ultrasound presenting for IOL due to GDM.   Reports fetal movement. Denies vaginal bleeding.  She received her prenatal care at [facility].  Support person in labor: family member  Ultrasounds . Anatomy U/S: No abnormal findings. Loreen Freud sono 10/2: EFW 2696g, 76%   Prenatal History/Complications: . Hemorrhage with previous pregnancy requiring blood transfusion  . DM, unknown control not checking CBGs, not on medications  . History of pre-eclampsia . History of DVT, on Lovenox 40 mg daily during pregnancy  . Asthma   Past Medical History: Past Medical History:  Diagnosis Date  . Asthma    used inhaler 09/04/18; change in atmosphere and activity  . Obesity     Past Surgical History: Past Surgical History:  Procedure Laterality Date  . DILATION AND CURETTAGE OF UTERUS      Obstetrical History: OB History    Gravida  25   Para  9   Term  7   Preterm  2   AB  15   Living  8     SAB  8   TAB  7   Ectopic      Multiple      Live Births  9           Social History: Social History   Socioeconomic History  . Marital status: Single    Spouse name: Not on file  . Number of children: 8  . Years of education: 17  . Highest education level: GED or equivalent  Occupational History  . Not on file  Social Needs  . Financial resource strain: Not hard at all  . Food insecurity:    Worry: Never true    Inability: Never true  . Transportation needs:    Medical: No    Non-medical: No  Tobacco Use  . Smoking status: Never Smoker  . Smokeless tobacco: Never Used  Substance and Sexual Activity  . Alcohol use: No  . Drug use: No  . Sexual activity: Not Currently    Birth control/protection: None  Lifestyle  . Physical activity:    Days per week: 0 days    Minutes per session: 0 min  . Stress: Rather much  Relationships  . Social connections:     Talks on phone: More than three times a week    Gets together: Never    Attends religious service: More than 4 times per year    Active member of club or organization: No    Attends meetings of clubs or organizations: Never    Relationship status: Never married  Other Topics Concern  . Not on file  Social History Narrative  . Not on file    Family History: Family History  Problem Relation Age of Onset  . Diabetes Mother   . Diabetes Father   . Heart disease Maternal Grandmother   . Diabetes Maternal Grandmother   . Hearing loss Maternal Grandfather     Allergies: No Known Allergies  Medications Prior to Admission  Medication Sig Dispense Refill Last Dose  . albuterol (PROVENTIL HFA;VENTOLIN HFA) 108 (90 Base) MCG/ACT inhaler Inhale 2 puffs into the lungs every 4 (four) hours as needed for wheezing or shortness of breath. 1 Inhaler 3 09/04/2018 at Unknown time  . enoxaparin (LOVENOX) 40 MG/0.4ML injection Inject 0.4 mLs (40 mg total) into the skin daily. 30 Syringe 11 09/03/2018 at Unknown time  .  ACCU-CHEK FASTCLIX LANCETS MISC 1 each by Percutaneous route 4 (four) times daily. 100 each 5 Taking  . aspirin EC 81 MG tablet Take 1 tablet (81 mg total) by mouth daily. Take after 12 weeks for prevention of preeclampsia later in pregnancy 300 tablet 2 08/29/2018  . glucose blood (ACCU-CHEK GUIDE) test strip Use one test strip to check blood glucose 4 times daily 100 each 5 Taking  . Prenatal Vit-Fe Fumarate-FA (PRENATAL COMPLETE) 14-0.4 MG TABS Take 1 tablet by mouth daily. 60 each 0 09/02/2018     Review of Systems  All systems reviewed and negative except as stated in HPI  Blood pressure 126/76, pulse 88, temperature 97.8 F (36.6 C), temperature source Oral, resp. rate 16, height 5\' 2"  (1.575 m), weight 121.6 kg. General appearance: cooperative Lungs: no respiratory distress Heart: regular rate  Abdomen: soft, non-tender; gravid b Pelvic: 2 cm dilated, cervix  thick Extremities: Homans sign is negative, no sign of DVT Presentation: cephalic Fetal monitoring: 140s/moderate variability/positive accelerations/no decelerations  Uterine activity: irritable Dilation: 1 Effacement (%): Thick Station: Ballotable Exam by:: Dr. Earlene Plater  Prenatal labs: ABO, Rh: O/Positive/-- (07/02 1343) Antibody: Negative (07/02 1343) Rubella: 2.89 (07/02 1343) RPR: Non Reactive (07/02 1343)  HBsAg: Negative (07/02 1343)  HIV: Non Reactive (07/02 1343)  GBS: Positive (10/17 1319)  Glucola: elevated Genetic screening: normal  Prenatal Transfer Tool  Maternal Diabetes: Yes:  Diabetes Type:  Pre-pregnancy Genetic Screening: Normal Maternal Ultrasounds/Referrals: Normal Fetal Ultrasounds or other Referrals:  None Maternal Substance Abuse:  No Significant Maternal Medications:  Meds include: Other:  Significant Maternal Lab Results: None  Results for orders placed or performed during the hospital encounter of 09/04/18 (from the past 24 hour(s))  CBC   Collection Time: 09/04/18  8:43 AM  Result Value Ref Range   WBC 11.0 (H) 4.0 - 10.5 K/uL   RBC 3.26 (L) 3.87 - 5.11 MIL/uL   Hemoglobin 9.0 (L) 12.0 - 15.0 g/dL   HCT 95.6 (L) 21.3 - 08.6 %   MCV 82.5 80.0 - 100.0 fL   MCH 27.6 26.0 - 34.0 pg   MCHC 33.5 30.0 - 36.0 g/dL   RDW 57.8 46.9 - 62.9 %   Platelets 263 150 - 400 K/uL    Patient Active Problem List   Diagnosis Date Noted  . PIH (pregnancy induced hypertension) 09/04/2018  . Diabetes mellitus complicating pregnancy, antepartum 06/04/2018  . Supervision of high risk pregnancy, antepartum 05/07/2018  . History of preterm delivery 05/07/2018  . AMA (advanced maternal age) multigravida 35+ 05/07/2018  . Grand multipara 05/07/2018  . History of pre-eclampsia 05/07/2018  . Asthma 05/07/2018  . History of DVT (deep vein thrombosis) 05/07/2018  . Unwanted fertility 05/07/2018    Assessment/Plan:  Amy Reynolds is a 37 y.o. [id number] at [redacted]w[redacted]d here  for IOL for DM. Pregnancy complicated by history of DVT, AMA, history of Pre-eclampsia with prior pregnancy, h/o PP hemorrhage.   Labor:  --induction, start with PO cytotec  -- pain control: epidural --due to high risk of PP hemorrhage (previous hemorrhage and grand multiparity) will give TXA after delivery in addition to Pitocin bolus and have Cytotec 800 mg at bedside   GDM: Initial CBG 102 at admit.  --check CBGs q4h during latent labor, q2 hours during active labor   Fetal Wellbeing: Cephalic by U/S performed 10/30.  -- GBS (+), prophylaxis with PCN  -- continuous fetal monitoring - category 1  Postpartum Planning -- breast and bottle feed -- Rhogam not  indicated --considering interval BTL, has not signed papers    Marcy Siren, D.O. [facility] Family Medicine Fellow, [facility], [facility] 09/04/2018, 1:26 PM

## 2018-09-04 NOTE — Progress Notes (Signed)
CBG 09/04/18 at 1520 was 92mg /dL.  Showing on device, not feeding to results review  Devra Dopp, RN

## 2018-09-04 NOTE — Anesthesia Preprocedure Evaluation (Signed)
Anesthesia Evaluation  Patient identified by MRN, date of birth, ID band Patient awake    Reviewed: Allergy & Precautions, NPO status , Patient's Chart, lab work & pertinent test results  Airway Mallampati: II  TM Distance: >3 FB Neck ROM: Full    Dental no notable dental hx.    Pulmonary asthma ,    Pulmonary exam normal breath sounds clear to auscultation       Cardiovascular hypertension, Normal cardiovascular exam Rhythm:Regular Rate:Normal     Neuro/Psych negative neurological ROS  negative psych ROS   GI/Hepatic negative GI ROS, Neg liver ROS,   Endo/Other  diabetes, GestationalMorbid obesity  Renal/GU negative Renal ROS  negative genitourinary   Musculoskeletal negative musculoskeletal ROS (+)   Abdominal (+) + obese,   Peds negative pediatric ROS (+)  Hematology negative hematology ROS (+)   Anesthesia Other Findings   Reproductive/Obstetrics (+) Pregnancy                             Anesthesia Physical Anesthesia Plan  ASA: III  Anesthesia Plan: Epidural   Post-op Pain Management:    Induction:   PONV Risk Score and Plan:   Airway Management Planned:   Additional Equipment:   Intra-op Plan:   Post-operative Plan:   Informed Consent:   Plan Discussed with:   Anesthesia Plan Comments:         Anesthesia Quick Evaluation

## 2018-09-04 NOTE — Progress Notes (Signed)
Patient ID: Amy Reynolds, female   DOB: 06/12/1981, 37 y.o.   MRN: [id number]  Comfortable w/ epidural; considering replacement of cervical foley due to int os being only slightly dilated; pt now with bloody show  BP 134/72, P 88 FHR 145-150s, +accels, occ mi variables Ctx q 2 mins with Pit at 13mu/min Cx now 4+/90/vtx -2; BBOW ruptured when touched, clear +bld tinged fluid  IUP@38 .5wks Early active labor now GDM Now with gHTN Hx DVT Hx PPH Hx pre-e  gHTN labs nl; CBG 81  Anticipate labor picking up now that cx thinner and ROM Plan for TXA and cytotec at bedside  Cam Hai 09/04/2018 10:51 PM

## 2018-09-05 ENCOUNTER — Encounter (HOSPITAL_COMMUNITY): Payer: Self-pay

## 2018-09-05 DIAGNOSIS — Z3A38 38 weeks gestation of pregnancy: Secondary | ICD-10-CM

## 2018-09-05 DIAGNOSIS — O99824 Streptococcus B carrier state complicating childbirth: Secondary | ICD-10-CM

## 2018-09-05 DIAGNOSIS — O24429 Gestational diabetes mellitus in childbirth, unspecified control: Secondary | ICD-10-CM

## 2018-09-05 LAB — CBC
HCT: 24.1 % — ABNORMAL LOW (ref 36.0–46.0)
HEMATOCRIT: 25.6 % — AB (ref 36.0–46.0)
Hemoglobin: 8.5 g/dL — ABNORMAL LOW (ref 12.0–15.0)
Hemoglobin: 8 g/dL — ABNORMAL LOW (ref 12.0–15.0)
MCH: 27.4 pg (ref 26.0–34.0)
MCH: 27.4 pg (ref 26.0–34.0)
MCHC: 33.2 g/dL (ref 30.0–36.0)
MCHC: 33.2 g/dL (ref 30.0–36.0)
MCV: 82.6 fL (ref 80.0–100.0)
MCV: 82.5 fL (ref 80.0–100.0)
PLATELETS: 230 10*3/uL (ref 150–400)
PLATELETS: 218 10*3/uL (ref 150–400)
RBC: 3.1 MIL/uL — ABNORMAL LOW (ref 3.87–5.11)
RBC: 2.92 MIL/uL — ABNORMAL LOW (ref 3.87–5.11)
RDW: 13.5 % (ref 11.5–15.5)
RDW: 13.1 % (ref 11.5–15.5)
WBC: 13.9 10*3/uL — AB (ref 4.0–10.5)
WBC: 13.9 10*3/uL — ABNORMAL HIGH (ref 4.0–10.5)

## 2018-09-05 LAB — GLUCOSE, RANDOM: GLUCOSE: 114 mg/dL — AB (ref 70–99)

## 2018-09-05 LAB — GLUCOSE, CAPILLARY: GLUCOSE-CAPILLARY: 92 mg/dL (ref 70–99)

## 2018-09-05 LAB — RPR: RPR Ser Ql: NONREACTIVE

## 2018-09-05 MED ORDER — BENZOCAINE-MENTHOL 20-0.5 % EX AERO: 1.0000 "application " | INHALATION_SPRAY | CUTANEOUS | Status: DC | PRN

## 2018-09-05 MED ORDER — TETANUS-DIPHTH-ACELL PERTUSSIS 5-2.5-18.5 LF-MCG/0.5 IM SUSP: 0.5000 mL | Freq: Once | INTRAMUSCULAR | Status: DC

## 2018-09-05 MED ORDER — ACETAMINOPHEN 325 MG PO TABS
650.0000 mg | ORAL_TABLET | ORAL | Status: DC | PRN
  Administered 2018-09-05: 650 mg via ORAL
  Filled 2018-09-05 (×2): qty 2

## 2018-09-05 MED ORDER — OXYTOCIN 40 UNITS IN LACTATED RINGERS INFUSION - SIMPLE MED: 2.5000 [IU]/h | INTRAVENOUS | Status: DC

## 2018-09-05 MED ORDER — ENOXAPARIN SODIUM 40 MG/0.4ML ~~LOC~~ SOLN
40.0000 mg | SUBCUTANEOUS | Status: DC
  Administered 2018-09-05 – 2018-09-06 (×2): 40 mg via SUBCUTANEOUS
  Filled 2018-09-05 (×2): qty 0.4

## 2018-09-05 MED ORDER — SIMETHICONE 80 MG PO CHEW: 80.0000 mg | CHEWABLE_TABLET | ORAL | Status: DC | PRN

## 2018-09-05 MED ORDER — DIPHENHYDRAMINE HCL 25 MG PO CAPS
25.0000 mg | ORAL_CAPSULE | Freq: Four times a day (QID) | ORAL | Status: DC | PRN
  Administered 2018-09-07: 25 mg via ORAL
  Filled 2018-09-05: qty 1

## 2018-09-05 MED ORDER — IBUPROFEN 600 MG PO TABS
600.0000 mg | ORAL_TABLET | Freq: Four times a day (QID) | ORAL | Status: DC
  Administered 2018-09-05 – 2018-09-07 (×9): 600 mg via ORAL
  Filled 2018-09-05 (×9): qty 1

## 2018-09-05 MED ORDER — KETOROLAC TROMETHAMINE 30 MG/ML IJ SOLN
30.0000 mg | Freq: Once | INTRAMUSCULAR | Status: AC
  Administered 2018-09-05: 30 mg via INTRAVENOUS
  Filled 2018-09-05: qty 1

## 2018-09-05 MED ORDER — SENNOSIDES-DOCUSATE SODIUM 8.6-50 MG PO TABS
2.0000 | ORAL_TABLET | ORAL | Status: DC
  Administered 2018-09-06: 2 via ORAL
  Filled 2018-09-05 (×2): qty 2

## 2018-09-05 MED ORDER — WITCH HAZEL-GLYCERIN EX PADS: 1.0000 "application " | MEDICATED_PAD | CUTANEOUS | Status: DC | PRN

## 2018-09-05 MED ORDER — MISOPROSTOL 200 MCG PO TABS
1000.0000 ug | ORAL_TABLET | Freq: Once | ORAL | Status: AC
  Administered 2018-09-05: 10000 ug via RECTAL

## 2018-09-05 MED ORDER — ENOXAPARIN SODIUM 40 MG/0.4ML ~~LOC~~ SOLN: 40.0000 mg | SUBCUTANEOUS | Status: DC

## 2018-09-05 MED ORDER — COCONUT OIL OIL: 1.0000 "application " | TOPICAL_OIL | Status: DC | PRN

## 2018-09-05 MED ORDER — ZOLPIDEM TARTRATE 5 MG PO TABS: 5.0000 mg | ORAL_TABLET | Freq: Every evening | ORAL | Status: DC | PRN

## 2018-09-05 MED ORDER — ONDANSETRON HCL 4 MG PO TABS: 4.0000 mg | ORAL_TABLET | ORAL | Status: DC | PRN

## 2018-09-05 MED ORDER — PRENATAL MULTIVITAMIN CH
1.0000 | ORAL_TABLET | Freq: Every day | ORAL | Status: DC
  Filled 2018-09-05 (×3): qty 1

## 2018-09-05 MED ORDER — ONDANSETRON HCL 4 MG/2ML IJ SOLN: 4.0000 mg | INTRAMUSCULAR | Status: DC | PRN

## 2018-09-05 MED ORDER — OXYCODONE HCL 5 MG PO TABS
5.0000 mg | ORAL_TABLET | ORAL | Status: AC | PRN
  Administered 2018-09-05 – 2018-09-06 (×2): 5 mg via ORAL
  Filled 2018-09-05 (×2): qty 1

## 2018-09-05 MED ORDER — DIBUCAINE 1 % RE OINT: 1.0000 "application " | TOPICAL_OINTMENT | RECTAL | Status: DC | PRN

## 2018-09-05 NOTE — Anesthesia Postprocedure Evaluation (Signed)
Anesthesia Post Note  Patient: Amy Reynolds  Procedure(s) Performed: AN AD HOC LABOR EPIDURAL     Patient location during evaluation: Mother Baby Anesthesia Type: Epidural Level of consciousness: awake and alert and oriented Pain management: satisfactory to patient Vital Signs Assessment: post-procedure vital signs reviewed and stable Respiratory status: respiratory function stable Cardiovascular status: stable Postop Assessment: no headache, no backache, epidural receding, patient able to bend at knees, no signs of nausea or vomiting and adequate PO intake Anesthetic complications: no    Last Vitals:  Vitals:   09/05/18 0410 09/05/18 0800  BP: 125/76 (!) 142/95  Pulse: 77 74  Resp: 18 18  Temp: 36.8 C 36.8 C  SpO2:      Last Pain:  Vitals:   09/05/18 0800  TempSrc: Oral  PainSc: Asleep   Pain Goal: Patients Stated Pain Goal: 2 (09/05/18 0045)               Karleen Dolphin

## 2018-09-05 NOTE — Discharge Summary (Addendum)
Obstetrics Discharge Summary OB/GYN Faculty Practice   Patient Name: Amy Reynolds DOB: 23-May-1981 MRN: [id number]  Date of admission: 09/04/2018 Delivering MD: Terisa Starr E   Date of discharge: 09/07/2018  Admitting diagnosis: 39WKS INDUCTION  Intrauterine pregnancy: [redacted]w[redacted]d     Secondary diagnosis:   Active Problems:   Supervision of high risk pregnancy, antepartum   History of preterm delivery   AMA (advanced maternal age) multigravida 35+   Grand multipara   History of pre-eclampsia   Asthma   History of DVT (deep vein thrombosis)   Unwanted fertility   Diabetes mellitus complicating pregnancy, antepartum   PIH (pregnancy induced hypertension)   Additional problems:  . GDM (unknown control)     Discharge diagnosis: Term Pregnancy Delivered, Gestational Hypertension and GDM                                            Postpartum procedures: None  Complications: None  Hospital course: Amy Reynolds is a 37 y.o. [redacted]w[redacted]d who was admitted for IOL 2/2 GDM. Her pregnancy was complicated by GDM (unknown type given unwillingness to check sugars), gHTN with normal UPC and HELLP labs, grand multiparity, history of a DVT requiring ppx with Lovenox.. Her labor course was uncomplicated. Delivery was uncomplicated but, given patient's history of severe PPH, she was given TXA and cytotec, as well as an extended infusion of Pitocin. Please see delivery note for additional details. Postpartum patient was placed on Lovenox 12hr after delivery given her history of DVT. She was breastfeeding without difficulty. By day of discharge, she was passing flatus, urinating, eating and drinking without difficulty. Her pain was well-controlled, and she was discharged home with ibuprofen. Prescribed HCTZ for blood pressure control and instructed to continue her home lovenox x 6wks. She will follow-up in clinic in 1 week for evaluation of her blood pressure and for a pre-op BTL visit.   Physical exam  Vitals:    09/06/18 0500 09/06/18 1930 09/07/18 0709 09/07/18 0844  BP: 139/73 131/84 134/80   Pulse: 70 70 68   Resp: 16 18 18    Temp: (!) 97.4 F (36.3 C) 97.8 F (36.6 C) 98.1 F (36.7 C)   TempSrc: Oral Oral Oral   SpO2: 100%   100%  Weight:      Height:       General: well appearing Lochia: appropriate Uterine Fundus: firm Incision: N/A DVT Evaluation: No evidence of DVT seen on physical exam. Negative Homan's sign.  Labs: Lab Results  Component Value Date   WBC 13.9 (H) 09/05/2018   HGB 8.0 (L) 09/05/2018   HCT 24.1 (L) 09/05/2018   MCV 82.5 09/05/2018   PLT 218 09/05/2018   CMP Latest Ref Rng & Units 09/05/2018  Glucose 70 - 99 mg/dL 409(W)  BUN 6 - 20 mg/dL -  Creatinine 1.19 - 1.47 mg/dL -  Sodium 829 - 562 mmol/L -  Potassium 3.5 - 5.1 mmol/L -  Chloride 98 - 111 mmol/L -  CO2 22 - 32 mmol/L -  Calcium 8.9 - 10.3 mg/dL -  Total Protein 6.5 - 8.1 g/dL -  Total Bilirubin 0.3 - 1.2 mg/dL -  Alkaline Phos 38 - 130 U/L -  AST 15 - 41 U/L -  ALT 0 - 44 U/L -    Discharge instructions: Per After Visit Summary and "Baby and Me Booklet"  After visit meds:  Allergies as of 09/07/2018   No Known Allergies     Medication List    TAKE these medications   ACCU-CHEK FASTCLIX LANCETS Misc 1 each by Percutaneous route 4 (four) times daily.   albuterol 108 (90 Base) MCG/ACT inhaler Commonly known as:  PROVENTIL HFA;VENTOLIN HFA Inhale 2 puffs into the lungs every 4 (four) hours as needed for wheezing or shortness of breath.   aspirin EC 81 MG tablet Take 1 tablet (81 mg total) by mouth daily. Take after 12 weeks for prevention of preeclampsia later in pregnancy   enoxaparin 40 MG/0.4ML injection Commonly known as:  LOVENOX Inject 0.4 mLs (40 mg total) into the skin daily.   glucose blood test strip Use one test strip to check blood glucose 4 times daily   hydrochlorothiazide 12.5 MG capsule Commonly known as:  MICROZIDE Take 2 capsules (25 mg total) by mouth  daily.   ibuprofen 600 MG tablet Commonly known as:  ADVIL,MOTRIN Take 1 tablet (600 mg total) by mouth every 6 (six) hours.   PRENATAL COMPLETE 14-0.4 MG Tabs Take 1 tablet by mouth daily.   senna-docusate 8.6-50 MG tablet Commonly known as:  Senokot-S Take 2 tablets by mouth daily. Start taking on:  09/08/2018       Postpartum contraception: Interval Tubal Ligation Diet: Routine Diet Activity: Advance as tolerated. Pelvic rest for 6 weeks.   Outpatient follow up:1 week Follow-up Appt: Future Appointments  Date Time Provider Department Center  09/12/2018  9:30 AM [facility] NURSE [facility] None  10/07/2018  1:00 PM Constant, Gigi Gin, MD [facility] None   Follow-up Visit:No follow-ups on file.  Newborn Data: Live born female  Birth Weight: 8 lb 4.3 oz (3750 g) APGAR: 5, 9  Newborn Delivery   Birth date/time:  09/05/2018 00:26:00 Delivery type:  Vaginal, Spontaneous     Baby Feeding: Bottle and Breast Disposition:home with mother  Burman Nieves, MD  FM Resident, Faculty Practice   CNM attestation I have seen and examined this patient and agree with above documentation in the resident's note.   Amy Reynolds is a 37 y.o. [id number] s/p SVD.   Pain is well controlled.  Plan for birth control is bilateral tubal ligation- interval Method of Feeding: both  PE:  BP 134/80 (BP Location: Left Arm)   Pulse 68   Temp 98.1 F (36.7 C) (Oral)   Resp 18   Ht 5\' 2"  (1.575 m)   Wt 121.6 kg   SpO2 100%   Breastfeeding? Unknown   BMI 49.02 kg/m  Fundus firm  No results for input(s): HGB, HCT in the last 72 hours.   Plan: discharge today - postpartum care discussed - f/u clinic in 1 week for BP check and pre-op visit; 4wks for postpartum visit   Saadiq Poche, CNM 1:02 PM

## 2018-09-06 NOTE — Lactation Note (Signed)
This note was copied from a baby's chart. Lactation Consultation Note  Patient Name: Amy Reynolds [id number]'[id number] Date: 09/06/2018 Reason for consult: Initial assessment;Early term 37-38.6wks;NICU baby;Maternal endocrine disorder Type of Endocrine Disorder?: Diabetes  61 hours old early term NICU female who is being exclusively formula fed for now, but mom called in and she voiced she's interested in pumping. Mom is a P9, but didn't BF any of her other kids, she tried with her last one while at the hospital but it didn't work out. Mom is not familiar with hand expression, when LC offered assistance mom politely declined stating she didn't want to be "touching herself", respected mom's wishes.  She requested a hand pump to take home, WIC already signed her up for the program, she doesn't have a pump at home, but she'll be getting one through [facility]. LC set mom up with a hand pump (per her request) and a DEBP. Instructions, cleaning and storage for both were reviewed as well as milk storage guidelines for NICU babies.   Feeding plan:  1. Mom will start pumping tonight with her DEBP, she's not ready to start right now. Mom voiced she's really overwhelmed with everything, so she'll try to do her best pumping at least 6-8 times in 24 hours if she can, but she's aware that the minimum is 8 pumping sessions for best results. 2. Once she starts getting drops/volume, she'll turn those in to her RN to be taken to the NICU  BF brochure, BF resources and pumping log were reviewed. Mom reported all questions and concerns were answered, she's aware of LC services and will call PRN.  Maternal Data Formula Feeding for Exclusion: Yes Reason for exclusion: Mother's choice to formula and breast feed on admission Has patient been taught Hand Expression?: No(LC offered but mom declined; she stated she doesn't want to be touching herself) Does the patient have breastfeeding experience prior to this delivery?: No(She has  9 kids but didn't BF, she only tried while at the hospital (about 2 days) with her last one; but didn't work out)  [employer] Type: Formula Nipple Type: Slow - flow    Interventions Interventions: Breast feeding basics reviewed;DEBP  Lactation Tools Discussed/Used Tools: Pump Breast pump type: Double-Electric Breast Pump;Manual WIC Program: Yes Pump Review: Setup, frequency, and cleaning;Milk Storage Initiated by:: MPeck Date initiated:: 09/06/18   Consult Status Consult Status: Follow-up Date: 09/07/18 Follow-up type: In-patient    Nia Nathaniel Venetia Constable 09/06/2018, 11:47 AM

## 2018-09-06 NOTE — Progress Notes (Addendum)
POSTPARTUM PROGRESS NOTE  Post Partum Day 1  Subjective:  Amy Reynolds is a 37 y.o. [id number] s/p SVD at [redacted]w[redacted]d  She reports she is doing fine. No acute events overnight. She endorses moderate to severe pain not well controlled on tylenol and ibuprofen. She denies any problems with ambulating, voiding or po intake. Endorses intermittent nausea but denies vomiting. Lochia is moderate but decreasing from prior. She indicates lightheadedness upon standing has improved from previous day.  Objective: Blood pressure 139/73, pulse 70, temperature (!) 97.4 F (36.3 C), temperature source Oral, resp. rate 16, height 5' 2"  (1.575 m), weight 121.6 kg, SpO2 100 %, unknown if currently breastfeeding.  Physical Exam:  General: alert, cooperative and no distress Chest: no respiratory distress Heart:regular rate, distal pulses intact Abdomen: soft, nontender,  Uterine Fundus: firm, appropriately tender DVT Evaluation: No calf swelling or tenderness Extremities: 1+ LE edema Skin: warm, dry  Recent Labs    09/05/18 0143 09/05/18 0535  HGB 8.5* 8.0*  HCT 25.6* 24.1*    Assessment/Plan: [name] a 37y.o. [id number]/p SVD at 334w6d PPD#1 - Doing well  Routine postpartum care  Cont. Lovenox Perineal Pain: Not well controlled on ibuprofen, tylenol. Consider cont. Oxycodone 59m67mightly to help sleep. Nausea: PRN Zofran Contraception: BTL as OP Feeding: Breast/bottle Dispo: Plan for discharge tomorrow.   LOS: 2 days   SeaHyacinth Meeker3 09/06/2018, 7:38 AM    I personally saw and evaluated the patient, performing the key elements of the service. I developed and verified the management plan that is described in the resident's/student's note, and I agree with the content with my edits above. VSS, HRR&R, Resp unlabored, Legs neg. Oxycodone 59mg20m q6hrs prn added FranNigel BertholdM 09/06/2018 8:03 AM

## 2018-09-06 NOTE — Progress Notes (Signed)
Patient screened out for psychosocial assessment since none of the following apply: °Psychosocial stressors documented in mother or baby's chart °Gestation less than 32 weeks °Code at delivery  °Infant with anomalies °Please contact the Clinical Social Worker if specific needs arise, by MOB's request, or if MOB scores greater than 9/yes to question 10 on [place] Postpartum Depression Screen. ° °Tolbert Matheson Boyd-Gilyard, MSW, LCSW °Clinical Social Work °[phone] °  °

## 2018-09-07 ENCOUNTER — Other Ambulatory Visit: Payer: Self-pay | Admitting: Internal Medicine

## 2018-09-07 LAB — GLUCOSE, CAPILLARY: GLUCOSE-CAPILLARY: 87 mg/dL (ref 70–99)

## 2018-09-07 MED ORDER — HYDROCHLOROTHIAZIDE 12.5 MG PO CAPS: 25.0000 mg | ORAL_CAPSULE | Freq: Every day | ORAL | 0 refills | Status: DC

## 2018-09-07 MED ORDER — ONDANSETRON HCL 4 MG PO TABS: 4.0000 mg | ORAL_TABLET | Freq: Every day | ORAL | 0 refills | Status: AC | PRN

## 2018-09-07 MED ORDER — HYDROCHLOROTHIAZIDE 12.5 MG PO CAPS: 12.5000 mg | ORAL_CAPSULE | Freq: Every day | ORAL | 0 refills | Status: DC

## 2018-09-07 MED ORDER — IBUPROFEN 600 MG PO TABS: 600.0000 mg | ORAL_TABLET | Freq: Four times a day (QID) | ORAL | 0 refills | Status: DC

## 2018-09-07 MED ORDER — ALBUTEROL SULFATE (2.5 MG/3ML) 0.083% IN NEBU
3.0000 mL | INHALATION_SOLUTION | RESPIRATORY_TRACT | Status: DC | PRN
  Administered 2018-09-07: 3 mL via RESPIRATORY_TRACT
  Filled 2018-09-07: qty 3

## 2018-09-07 MED ORDER — OXYCODONE HCL 5 MG PO CAPS: 5.0000 mg | ORAL_CAPSULE | ORAL | 0 refills | Status: DC | PRN

## 2018-09-07 MED ORDER — OXYCODONE HCL 5 MG PO TABS
5.0000 mg | ORAL_TABLET | Freq: Once | ORAL | Status: AC
  Administered 2018-09-07: 5 mg via ORAL
  Filled 2018-09-07: qty 1

## 2018-09-07 MED ORDER — HYDROCHLOROTHIAZIDE 12.5 MG PO CAPS
12.5000 mg | ORAL_CAPSULE | Freq: Every day | ORAL | Status: DC
  Administered 2018-09-07: 12.5 mg via ORAL
  Filled 2018-09-07 (×2): qty 1

## 2018-09-07 MED ORDER — SENNOSIDES-DOCUSATE SODIUM 8.6-50 MG PO TABS: 2.0000 | ORAL_TABLET | ORAL | 0 refills | Status: DC

## 2018-09-07 NOTE — Lactation Note (Signed)
This note was copied from a baby's chart. Lactation Consultation Note  Patient Name: Amy Reynolds RUEAV'W Date: 09/07/2018 Reason for consult: Follow-up assessment;NICU baby;Early term 37-38.6wks Type of Endocrine Disorder?: Diabetes  Baby is 13 hours old  1st attempt to see mom / having a respiratory tx , 2nd attempt mom willing to  Be set up with DEBP. LC reviewed set up of the DEBP and assisted mom the  #24 F was comfortable for mom. 7 mins into pumping per mom to sleepy to continue  Due to pain med.  LC had reviewed sore nipple and engorgement prevention and tx. Per mom nipples  Tender with pumping and breast are fuller today, but has not pumped since the baby was born.  LC reviewed supply and demand and importance of pumping at least 8-10 x's in 24 hours.  Due to mom having the Formula packet - not able to obtain a [facility] loaner.  LC recommended pumping with the hand pump and when visiting baby in NICU to pump.  Mother informed of post-discharge support and given phone number to the lactation department, including services for phone call assistance; out-patient appointments; and breastfeeding support group. List of other breastfeeding resources in the community given in the handout. Encouraged mother to call for problems or concerns related to breastfeeding.   Maternal Data Has patient been taught Hand Expression?: Yes  Feeding Feeding Type: Formula Nipple Type: Slow - flow  LATCH Score                   Interventions Interventions: Breast feeding basics reviewed  Lactation Tools Discussed/Used Tools: Pump;Flanges Flange Size: 24;27;Other (comment)(good fit for today - LC checked ) Breast pump type: Double-Electric Breast Pump;Manual WIC Program: Yes(per mom has already seen WIC - and has the formula packet - plans to call [facility] when she goes home to change it ) Pump Review: Setup, frequency, and cleaning;Milk Storage Initiated by:: MAI reviewed  Date  initiated:: 09/07/18   Consult Status Consult Status: Complete Date: 09/07/18 Follow-up type: In-patient    Amy Reynolds 09/07/2018, 9:53 AM

## 2018-09-07 NOTE — Discharge Instructions (Signed)

## 2018-09-08 ENCOUNTER — Other Ambulatory Visit: Payer: Self-pay | Admitting: Obstetrics and Gynecology

## 2018-09-08 LAB — BPAM RBC
Blood Product Expiration Date: 201911142359
Blood Product Expiration Date: 201911142359
UNIT TYPE AND RH: 5100
Unit Type and Rh: 5100

## 2018-09-08 LAB — TYPE AND SCREEN
ABO/RH(D): O POS
Antibody Screen: NEGATIVE
UNIT DIVISION: 0
Unit division: 0

## 2018-09-08 MED ORDER — OXYCODONE HCL 5 MG PO TABS: 5.0000 mg | ORAL_TABLET | ORAL | 0 refills | Status: DC | PRN

## 2018-09-08 NOTE — Progress Notes (Signed)
Received call from after hour nurse. Patient was discharged home with oxycodone capsule which is not covered by the patient's insurance. Patient attempted to have the prescription changed to tablets instead throughout the day without success. Per the nurse patient was very upset on the phone. Nurse was not able to assess whether the patient had tried ibuprofen to control her pain. Patient had an uncomplicated vaginal delivery on 09/05/2018 and was discharged on 09/07/2018. The prescription for oxycodone capsule was changed to tablets limiting the quantity to 6 tablets. The nurse was instructed to inform the patient that the narcotic prescription will not be renewed and she should be using ibuprofen to control her pain as a first line agent

## 2018-09-10 ENCOUNTER — Telehealth: Payer: Self-pay

## 2018-09-10 NOTE — Telephone Encounter (Signed)
F/U call to pt regarding BP  Pt had not taken BP medication when Nurse came to do B/P Reading were 170/100 Pt has since taking Rx and states she feels "great" I consulted w/ provider Pt needs to have B/P reading repeating in am and if the same pt needs to go to [facility]U.  (Call w/ pt was disconnected / or she hung up) Nurse made aware and will contact pt to schedule B/P reading.

## 2018-09-11 ENCOUNTER — Telehealth: Payer: Self-pay

## 2018-09-11 ENCOUNTER — Telehealth: Payer: Self-pay | Admitting: *Deleted

## 2018-09-11 NOTE — Telephone Encounter (Signed)
Pt called and was inquiring about her appt tomorrow for her BP Check Pt stated that the nurse checked her BP today and it was " fine "  I talked to nurse triage and Dr. Clearance Coots pt needed to come in ... Pt hung up phone or it was disconnect.. Triaged nurse advised.

## 2018-09-11 NOTE — Telephone Encounter (Signed)
TC from [employer] nurse w/ [facility] [place] aware of pt  Blood Pressure readings  Left arm:164/94 Did decrease from yesterdays reading however still high and Shanda noted pt has swelling  Pt has B/P check appt 09/12/18 @ 9:30am Pt contacted office after 4 :20pm today  to cancel appt for B/P check told scheduling her B/P was "fine" Made scheduling aware pt needs to keep appt. Per provider in the office at this time.

## 2018-10-07 ENCOUNTER — Ambulatory Visit: Payer: Medicaid Other | Admitting: Obstetrics and Gynecology

## 2019-04-23 IMAGING — US US TRANSVAGINAL NON-OB
1 series · 14 of 25 positions shown · non-contrast
Comparison: None.

CLINICAL DATA: Vaginal bleeding

EXAM:
OBSTETRIC <14 WK US AND TRANSVAGINAL OB US
TECHNIQUE: Both transabdominal and transvaginal ultrasound examinations were
performed for complete evaluation of the gestation as well as the
maternal uterus, adnexal regions, and pelvic cul-de-sac.
Transvaginal technique was performed to assess early pregnancy.

[Series 1: us transvaginal non-ob · 0.24mm/px · 14 of 59 slices shown]
[im 1/59]
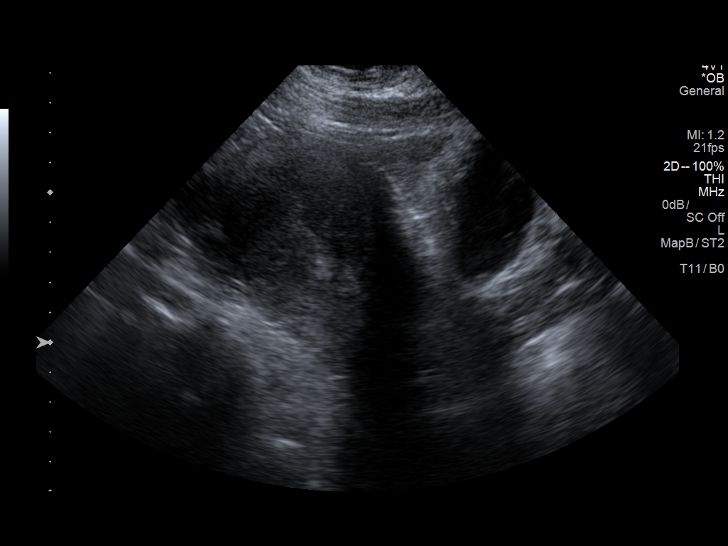
[im 5/59]
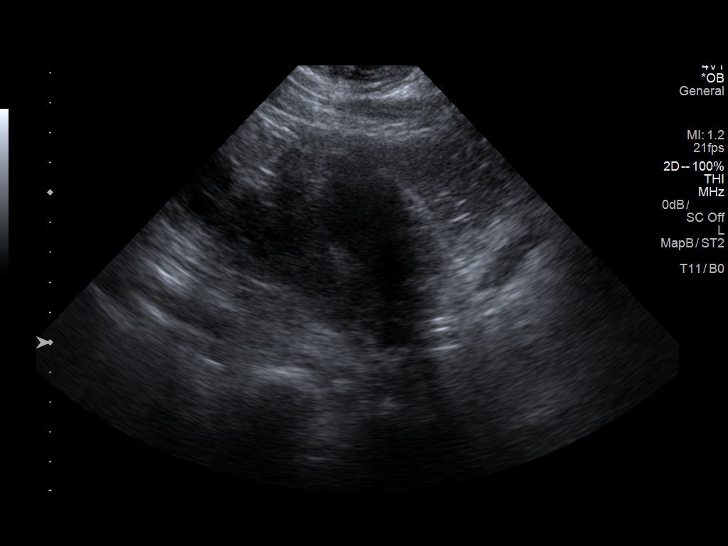
[im 10/59]
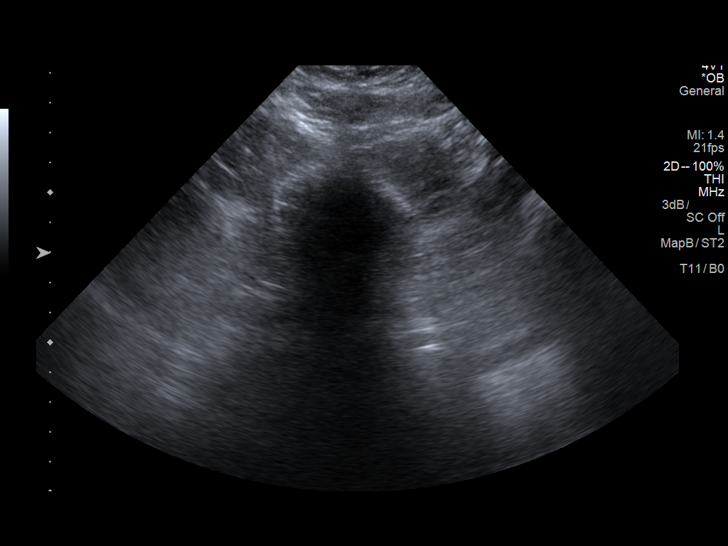
[im 15/59]
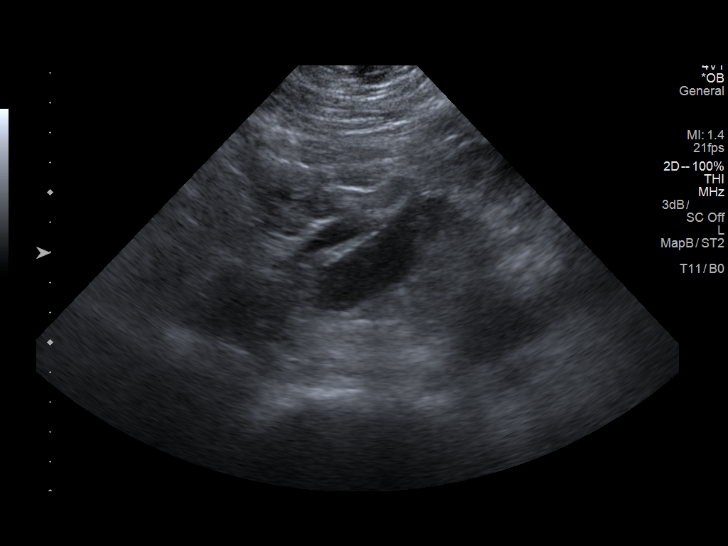
[im 20/59]
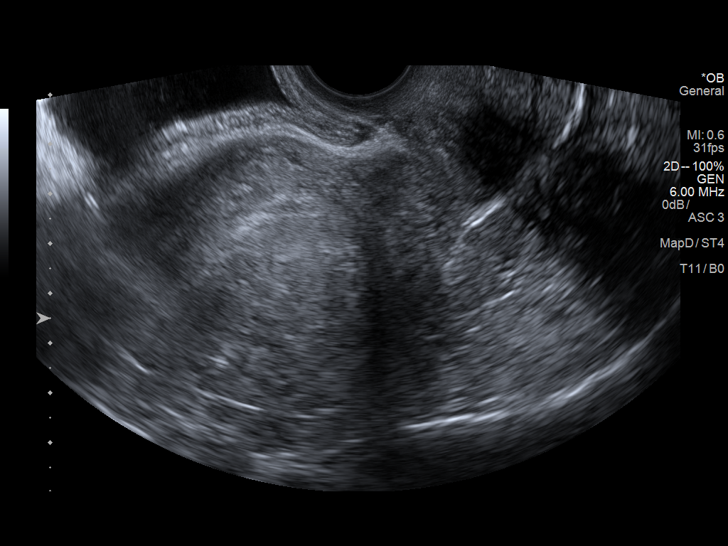
[im 22/59]
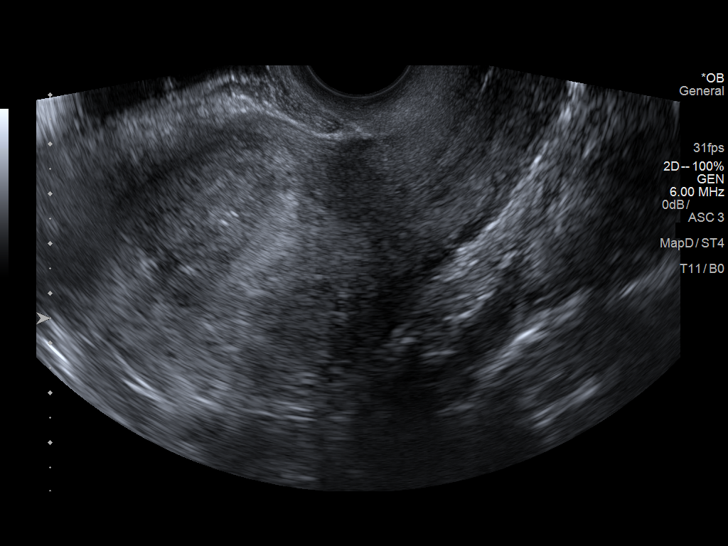
[im 27/59]
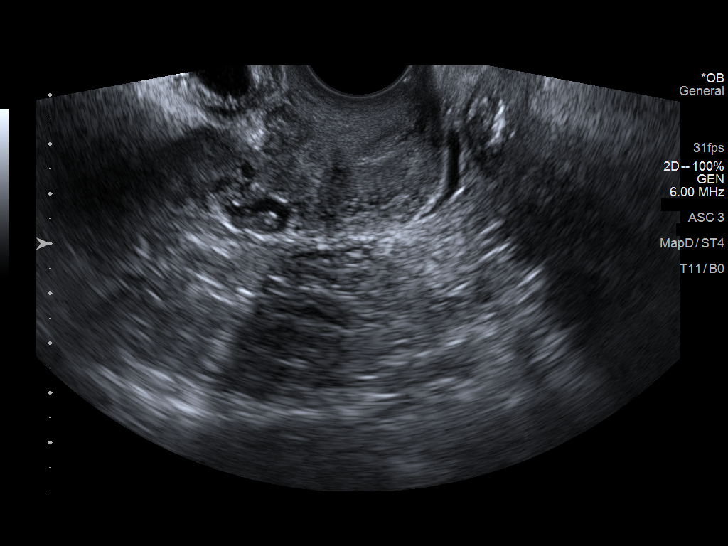
[im 32/59]
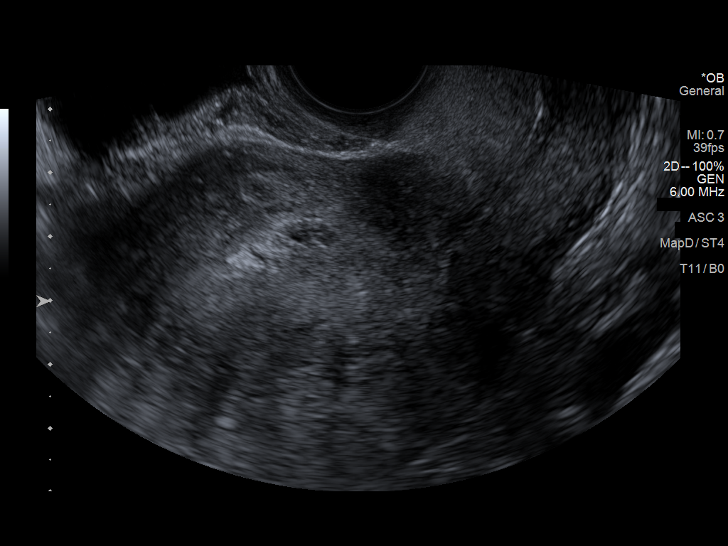
[im 37/59]
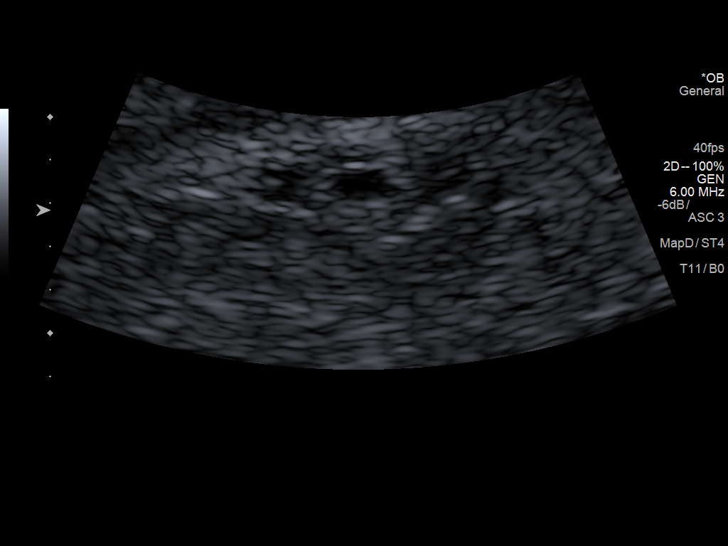
[im 39/59]
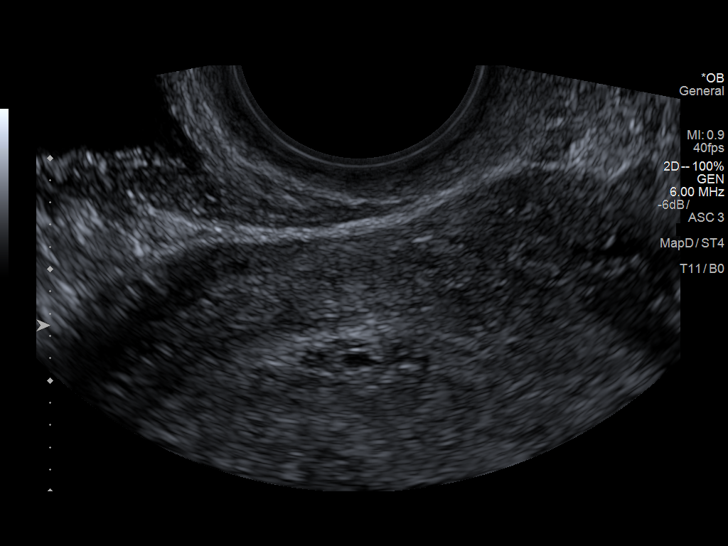
[im 44/59]
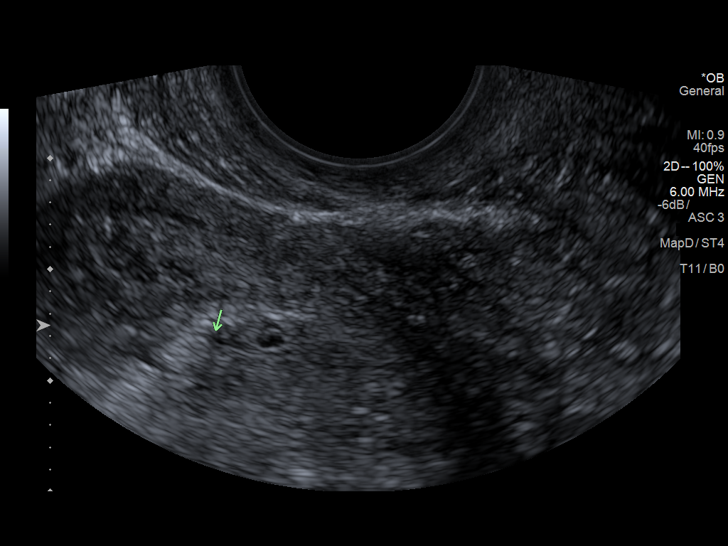
[im 49/59]
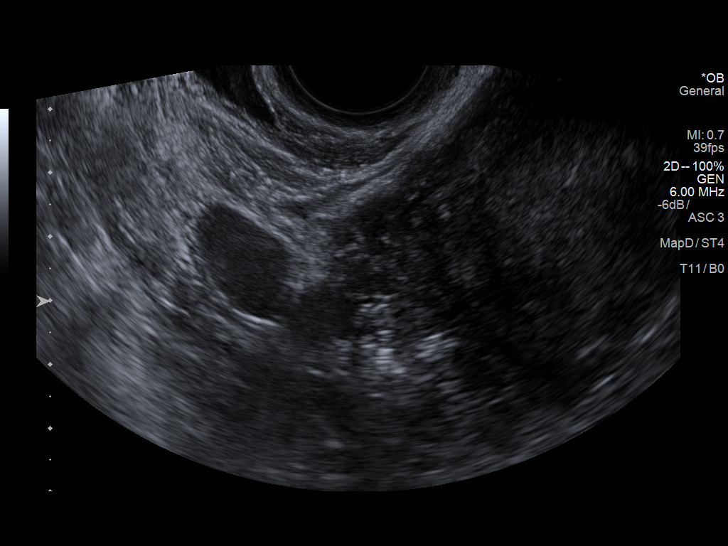
[im 54/59]
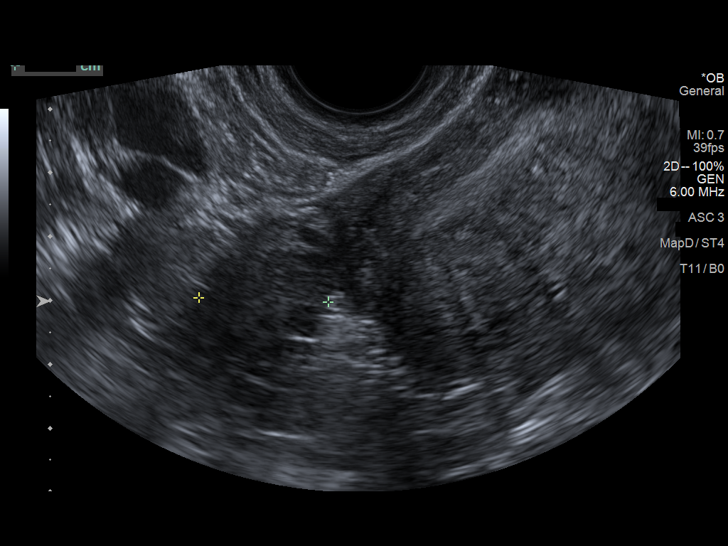
[im 59/59]
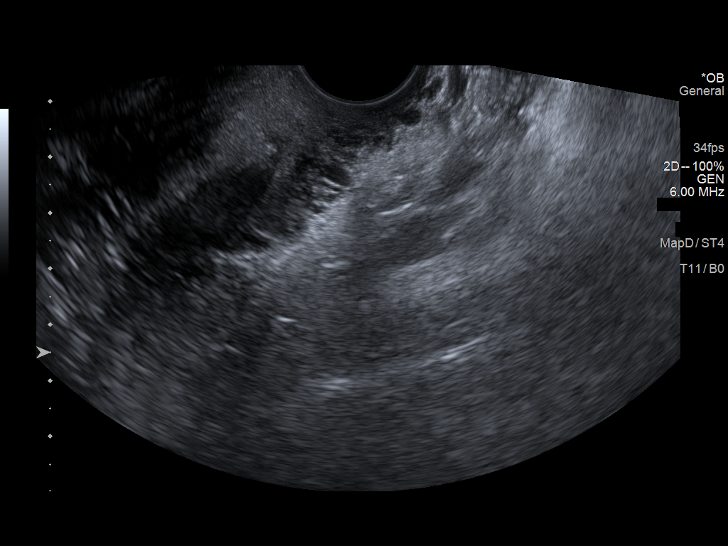

[14 of 25 positions shown; findings below may reference images not displayed]

FINDINGS: Intrauterine gestational sac: Possible very early small gestational
sac versus pseudo gestational sac

Yolk sac:  None visualized

Embryo:  None visualized

Cardiac Activity:

Heart Rate:   bpm

MSD: 1.9 mm, too small to date

CRL:    mm    w    d                  US EDC:

Subchorionic hemorrhage:  Possible small subchorionic hemorrhage.

Maternal uterus/adnexae: No adnexal mass. Trace free fluid in the
pelvis.
IMPRESSION: Question very early intrauterine gestational sac, 1.9 mm mean sac
diameter, too small to date. Adjacent very small subchorionic
hemorrhage suspected.

This could be followed with repeat ultrasound in 14 days to evaluate
for change.

## 2019-09-19 ENCOUNTER — Other Ambulatory Visit: Payer: Self-pay | Admitting: Advanced Practice Midwife

## 2019-12-10 IMAGING — US US MFM OB DETAIL+14 WK
1 series · 14 of 28 positions shown · non-contrast
Comparison: none

[Series 1: us mfm ob detail+14 wk · 89 acquisitions, 14 frames shown]
[im 4/89]
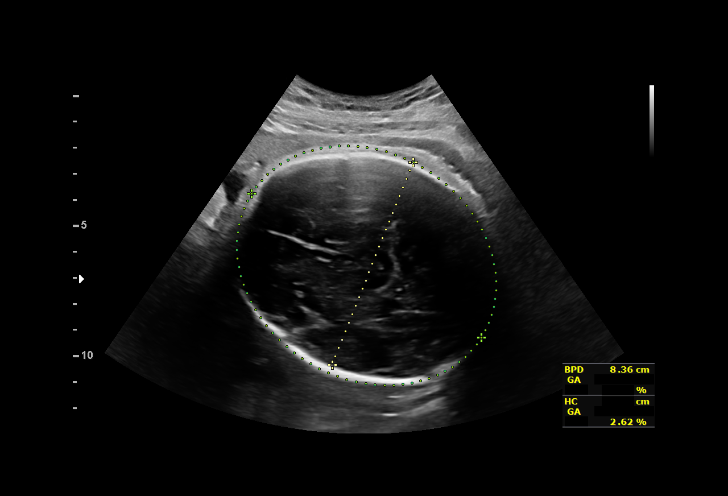
[im 10/89]
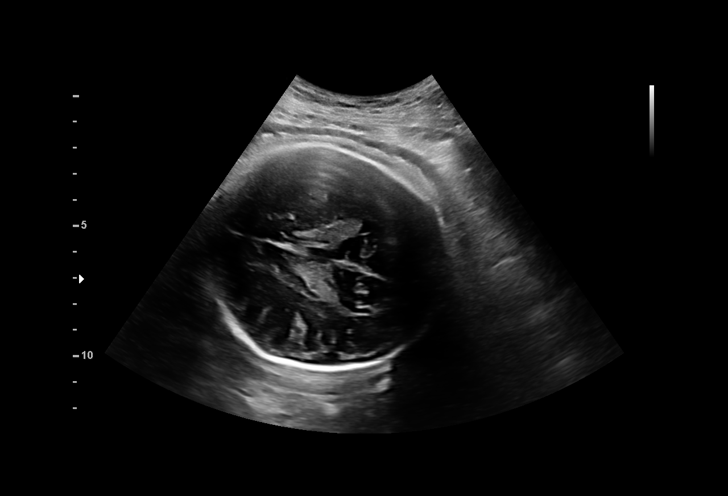
[im 17/89]
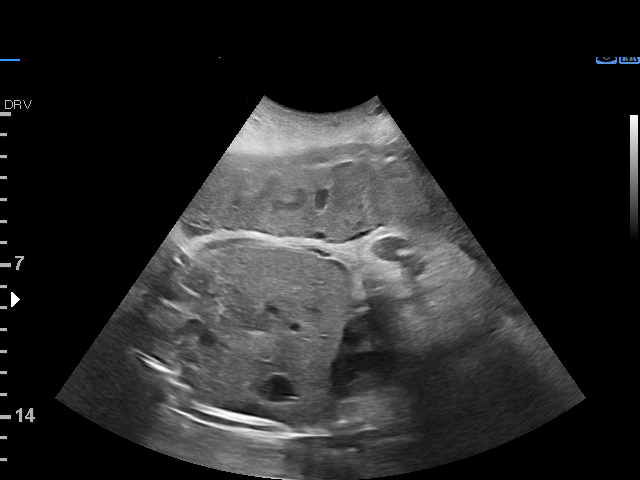
[im 23/89]
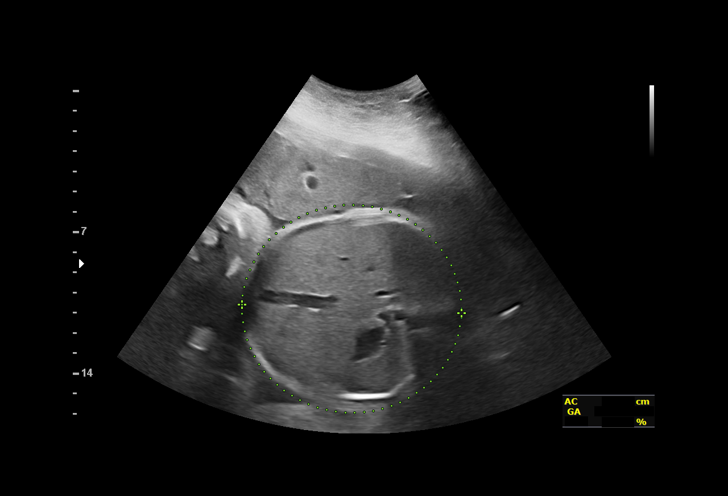
[im 30/89]
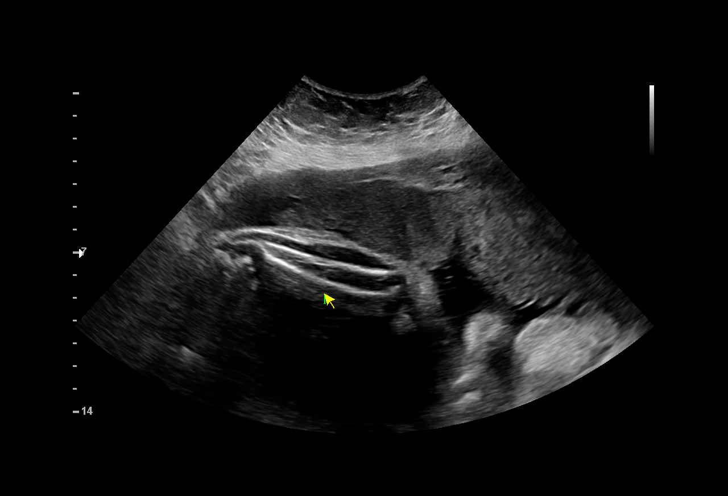
[im 36/89]
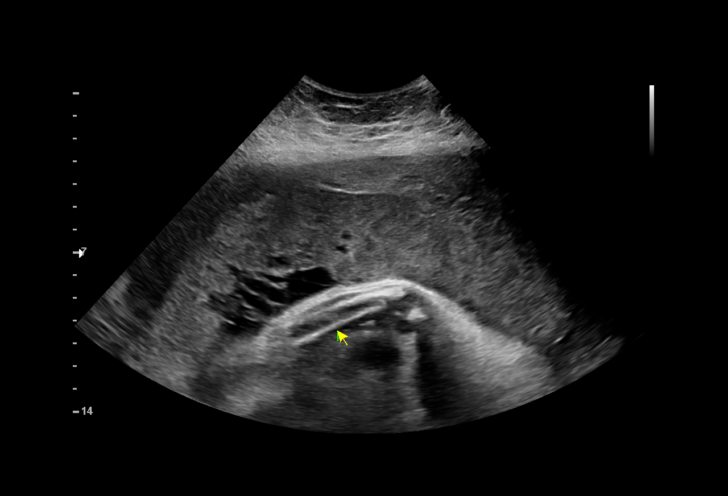
[im 43/89]
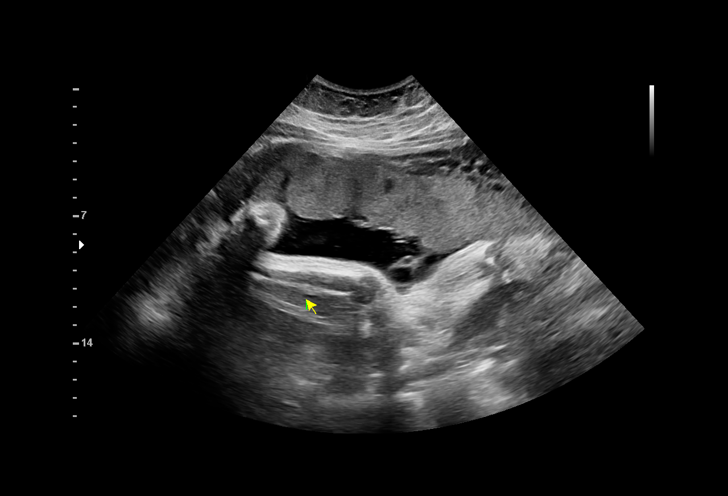
[im 49/89]
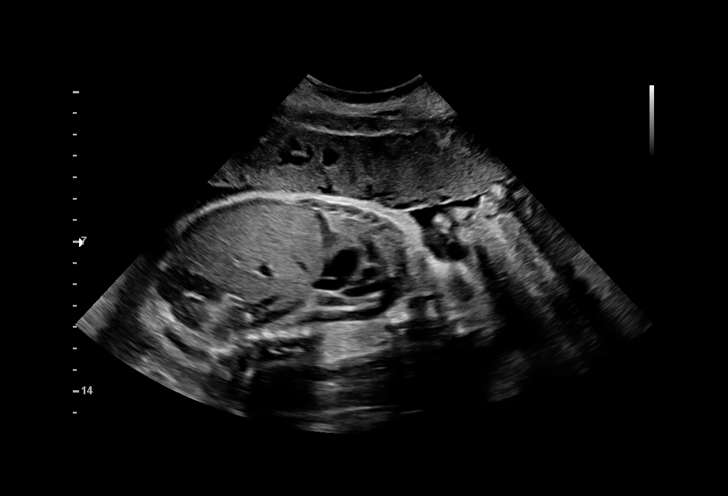
[im 56/89]
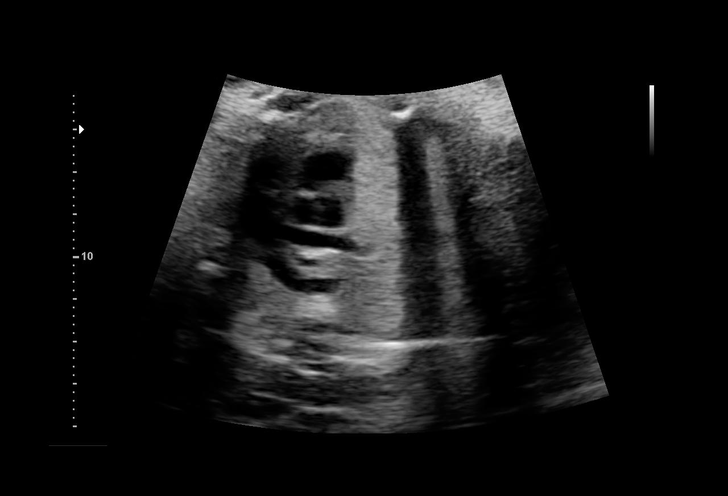
[im 62/89]
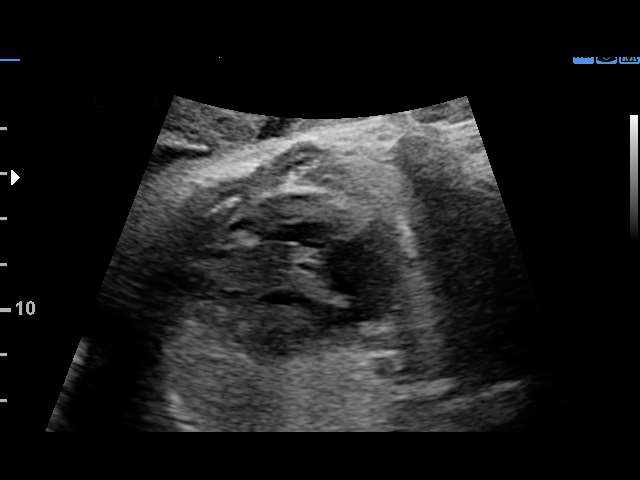
[im 69/89]
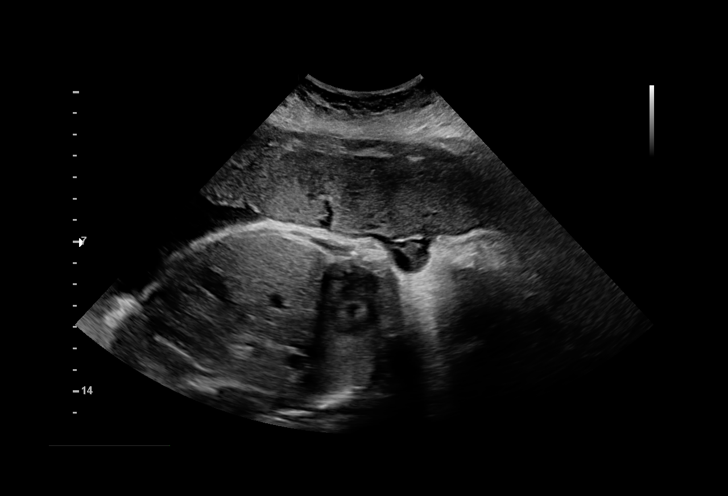
[im 75/89]
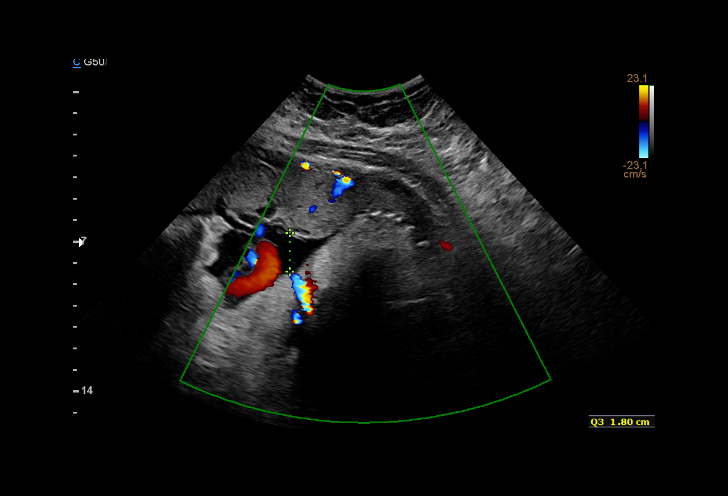
[im 82/89]
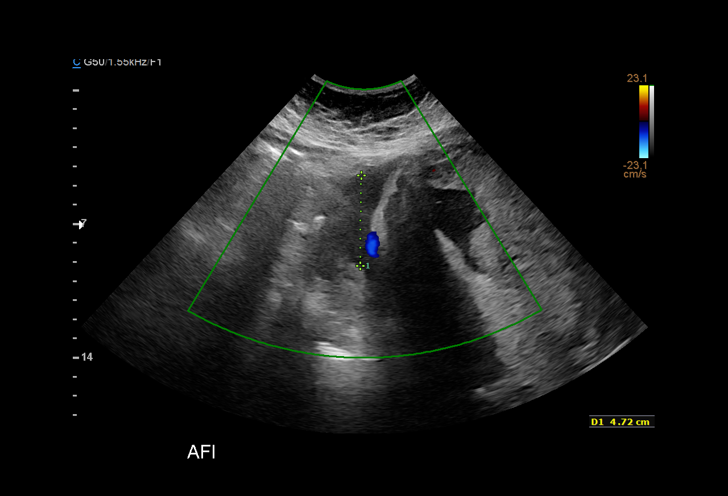
[im 89/89]
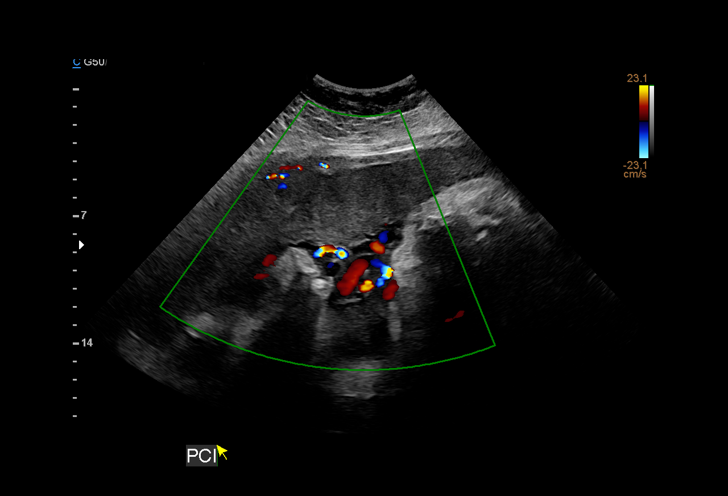

[14 of 28 positions shown; findings below may reference images not displayed]

Indications

Encounter for antenatal screening for
malformations
34 weeks gestation of pregnancy
Poor obstetric history: Previous
preeclampsia / eclampsia/gestational HTN
Poor obstetrical history (DVT, lovenox)
Advanced maternal age multigravida 35+,
third trimester
Grand multiparity, antepartum (G25)
Pre-existing diabetes, type 2, in pregnancy,
third trimester
Asthma (albuterol)
Poor obstetric history (prior pre-term labor)
(28w and 29w)
Vital Signs

BMI:         49.01        Pulse:  92
BP:          109/73
Fetal Evaluation

Num Of Fetuses:         1
Fetal Heart Rate(bpm):  145
Cardiac Activity:       Observed
Presentation:           Cephalic
Placenta:               Anterior
P. Cord Insertion:      Not well visualized
Amniotic Fluid
AFI FV:      Within normal limits

AFI Sum(cm)     %Tile       Largest Pocket(cm)
9.42            16

RUQ(cm)       RLQ(cm)       LUQ(cm)        LLQ(cm)
2.94
Biophysical Evaluation

Amniotic F.V:   Within normal limits       F. Tone:        Observed
F. Movement:    Observed                   Score:          [DATE]
F. Breathing:   Observed
Biometry

BPD:        84  mm     G. Age:  33w 6d         25  %    CI:        76.13   %    70 - 86
FL/HC:      22.1   %    20.1 -
HC:      305.1  mm     G. Age:  33w 6d          8  %    HC/AC:      0.93        0.93 -
AC:      327.1  mm     G. Age:  36w 4d         94  %    FL/BPD:     80.1   %    71 - 87
FL:       67.3  mm     G. Age:  34w 4d         39  %    FL/AC:      20.6   %    20 - 24
HUM:      58.4  mm     G. Age:  33w 6d         46  %
CER:      44.4  mm     G. Age:  38w 3d         85  %

LV:        3.1  mm
CM:          8  mm

Est. FW:    7393  gm    5 lb 15 oz      76  %
OB History

Gravidity:    25        Term:   7        Prem:   2        SAB:   8
TOP:          7        Living:  8
Gestational Age

U/S Today:     34w 5d                                        EDD:   09/13/18
Best:          34w 5d     Det. By:  Early Ultrasound         EDD:   09/13/18
(03/23/18)
Anatomy

Cranium:               Appears normal         Aortic Arch:            Appears normal
Cavum:                 Appears normal         Ductal Arch:            Appears normal
Ventricles:            Appears normal         Diaphragm:              Appears normal
Choroid Plexus:        Appears normal         Stomach:                Appears normal, left
sided
Cerebellum:            Appears normal         Abdomen:                Appears normal
Posterior Fossa:       Appears normal         Abdominal Wall:         Not well visualized
Nuchal Fold:           Not applicable (>20    Cord Vessels:           Not well visualized
wks GA)
Face:                  Orbits nl; profile not Kidneys:                Appear normal
well visualized
Lips:                  Not well visualized    Bladder:                Appears normal
Thoracic:              Appears normal         Spine:                  Not well visualized
Heart:                 Not well visualized    Upper Extremities:      RUE vis; LUE nwv
RVOT:                  Appears normal         Lower Extremities:      Visualized
LVOT:                  Appears normal
Cervix Uterus Adnexa
Cervix
Not visualized (advanced GA >06wks)

Uterus
No abnormality visualized.

Left Ovary
Not visualized.

Right Ovary
Not visualized.
Comments

U/S images reviewed. Findings reviewed with patient.
Appropriate fetal growth is noted.  No fetal abnormalities are
seen.  No evidence of fetal compromise is found on BPP
today.
Questions answered.
10 minutes spent face to face with patient.
Recommendations: 1) Serial U/S every 4 weeks for fetal
growth 2) Weekly BPP with NST
Recommendations

1) Serial U/S every 4 weeks for fetal growth 2) Weekly BPP
with NST

## 2022-04-01 ENCOUNTER — Emergency Department (HOSPITAL_COMMUNITY)
Admission: EM | Admit: 2022-04-01 | Discharge: 2022-04-01 | Disposition: A | Discharge status: Home / Self Care | Payer: Medicaid Other | Attending: Emergency Medicine | Admitting: Emergency Medicine

## 2022-04-01 ENCOUNTER — Other Ambulatory Visit: Payer: Self-pay

## 2022-04-01 ENCOUNTER — Encounter (HOSPITAL_COMMUNITY): Payer: Self-pay | Admitting: Emergency Medicine

## 2022-04-01 ENCOUNTER — Emergency Department (HOSPITAL_COMMUNITY): Payer: Medicaid Other

## 2022-04-01 DIAGNOSIS — I10 Essential (primary) hypertension: Secondary | ICD-10-CM | POA: Diagnosis not present

## 2022-04-01 DIAGNOSIS — R0789 Other chest pain: Secondary | ICD-10-CM

## 2022-04-01 DIAGNOSIS — J45909 Unspecified asthma, uncomplicated: Secondary | ICD-10-CM | POA: Insufficient documentation

## 2022-04-01 DIAGNOSIS — R0602 Shortness of breath: Secondary | ICD-10-CM | POA: Diagnosis not present

## 2022-04-01 DIAGNOSIS — Z79899 Other long term (current) drug therapy: Secondary | ICD-10-CM | POA: Diagnosis not present

## 2022-04-01 DIAGNOSIS — R7309 Other abnormal glucose: Secondary | ICD-10-CM | POA: Insufficient documentation

## 2022-04-01 DIAGNOSIS — Z7982 Long term (current) use of aspirin: Secondary | ICD-10-CM | POA: Insufficient documentation

## 2022-04-01 LAB — TROPONIN I (HIGH SENSITIVITY): Troponin I (High Sensitivity): 4 ng/L (ref <18)

## 2022-04-01 LAB — BASIC METABOLIC PANEL
Anion gap: 9 (ref 5–15)
BUN: 10 mg/dL (ref 6–20)
CO2: 26 mmol/L (ref 22–32)
Calcium: 8.8 mg/dL — ABNORMAL LOW (ref 8.9–10.3)
Chloride: 101 mmol/L (ref 98–111)
Creatinine, Ser: 0.9 mg/dL (ref 0.44–1.00)
GFR, Estimated: >60 mL/min (ref >60)
Glucose, Bld: 207 mg/dL — ABNORMAL HIGH (ref 70–99)
Potassium: 3.7 mmol/L (ref 3.5–5.1)
Sodium: 136 mmol/L (ref 135–145)

## 2022-04-01 LAB — BRAIN NATRIURETIC PEPTIDE: B Natriuretic Peptide: 11.4 pg/mL (ref 0.0–100.0)

## 2022-04-01 LAB — CBC
HCT: 35.5 % — ABNORMAL LOW (ref 36.0–46.0)
Hemoglobin: 12 g/dL (ref 12.0–15.0)
MCH: 28.4 pg (ref 26.0–34.0)
MCHC: 33.8 g/dL (ref 30.0–36.0)
MCV: 84.1 fL (ref 80.0–100.0)
Platelets: 319 10*3/uL (ref 150–400)
RBC: 4.22 MIL/uL (ref 3.87–5.11)
RDW: 13.5 % (ref 11.5–15.5)
WBC: 9.2 10*3/uL (ref 4.0–10.5)
nRBC: 0 % (ref 0.0–0.2)

## 2022-04-01 LAB — I-STAT BETA HCG BLOOD, ED (MC, WL, AP ONLY): I-stat hCG, quantitative: <5 m[IU]/mL (ref <5)

## 2022-04-01 LAB — D-DIMER, QUANTITATIVE: D-Dimer, Quant: 0.27 ug/mL-FEU (ref 0.00–0.50)

## 2022-04-01 MED ORDER — KETOROLAC TROMETHAMINE 30 MG/ML IJ SOLN
30.0000 mg | Freq: Once | INTRAMUSCULAR | Status: DC
  Filled 2022-04-01: qty 1

## 2022-04-01 MED ORDER — IBUPROFEN 400 MG PO TABS
600.0000 mg | ORAL_TABLET | Freq: Once | ORAL | Status: DC
  Filled 2022-04-01: qty 1

## 2022-04-01 MED ORDER — KETOROLAC TROMETHAMINE 15 MG/ML IJ SOLN
15.0000 mg | Freq: Once | INTRAMUSCULAR | Status: DC
Start: 2022-04-01 — End: 2022-04-01

## 2022-04-01 NOTE — Discharge Instructions (Addendum)
Please try taking 600 mg ibuprofen every 6 hours for 3-4 days to see if it helps your chest pain symptoms. Do not take this for longer than one week.  I have also provided you with a couple of primary care offices that you can call to try to set up an appointment with. You had an elevated glucose and you should be tested for diabetes at some point.

## 2022-04-01 NOTE — ED Triage Notes (Signed)
Pt reported to ED with c/o bilateral chest pain upon awakening this morning Pt desribes pain as "tightness" and states she feels short of breath with exertion. Pt also states pain is so severe she cannot move her neck.

## 2022-04-01 NOTE — ED Provider Notes (Signed)
[facility] EMERGENCY DEPARTMENT Provider Note   CSN: [id number] Arrival date & time: 04/01/22  [id number]     History PMH: obesity, asthma, hx of dvt in pregnancy, hx of diabetes in pregnancy Chief Complaint  Patient presents with   Chest Pain   Shortness of Breath    Amy Reynolds is a 41 y.o. female. Presents the ED with a chief complaint of chest pain.  She says yesterday she noticed a type this in her back that was constant.  She says she initially thought this was a muscle ache so she has been trying IcyHot on it with no relief.  She states this morning she woke up and she started noticing chest pressure and tightness around the bilateral upper chest feels like a rubber band is squeezing her.  She says that this is worse when she tries to take deep breaths and feels like she is not getting good breaths.  She also states that the pain is worse when she tries to sit up or moves.  The pain also extends up into her neck at times.  She has some shortness of breath with exertion.  She states that she does have a history of asthma, however she has been compliant with her inhalers and does not feel that this is due to her asthma.  She denies any nausea, vomiting, visual disturbance, numbness, weakness of extremities, leg swelling.  She is on Lasix for chronic leg edema, however she has not needed this recently.  She also states she has a history of DVT when she was pregnant and was on blood thinners at the time, but she is no longer on these due to the thought of the clots being provoked.  She denies any recent travel or any recent surgeries. No active malignancy or hemoptysis.   Chest Pain Associated symptoms: shortness of breath   Associated symptoms: no abdominal pain, no cough, no dizziness, no fever, no nausea, no numbness, no palpitations, no vomiting and no weakness   Shortness of Breath Associated symptoms: chest pain   Associated symptoms: no abdominal pain, no cough, no  fever, no sore throat, no vomiting and no wheezing    HPI: A 41 year old patient with a history of hypertension and obesity presents for evaluation of chest pain. Initial onset of pain was more than 6 hours ago. The patient's chest pain is not worse with exertion. The patient's chest pain is middle- or left-sided, is not well-localized, is not described as heaviness/pressure/tightness, is not sharp and does radiate to the arms/jaw/neck. The patient does not complain of nausea and denies diaphoresis. The patient has no history of stroke, has no history of peripheral artery disease, has not smoked in the past 90 days, denies any history of treated diabetes, has no relevant family history of coronary artery disease (first degree relative at less than age 46) and has no history of hypercholesterolemia.   Home Medications Prior to Admission medications   Medication Sig Start Date End Date Taking? Authorizing Provider  ACCU-CHEK FASTCLIX LANCETS MISC 1 each by Percutaneous route 4 (four) times daily. 07/29/18   Woodroe Mode, MD  albuterol (PROVENTIL HFA;VENTOLIN HFA) 108 (90 Base) MCG/ACT inhaler Inhale 2 puffs into the lungs every 4 (four) hours as needed for wheezing or shortness of breath. 03/24/18   Muthersbaugh, Jarrett Soho, PA-C  aspirin EC 81 MG tablet Take 1 tablet (81 mg total) by mouth daily. Take after 12 weeks for prevention of preeclampsia later in pregnancy 05/07/18  Hermina Staggers, MD  enoxaparin (LOVENOX) 40 MG/0.4ML injection Inject 0.4 mLs (40 mg total) into the skin daily. 05/07/18   Hermina Staggers, MD  glucose blood (ACCU-CHEK GUIDE) test strip Use one test strip to check blood glucose 4 times daily 07/29/18   Adam Phenix, MD  hydrochlorothiazide (MICROZIDE) 12.5 MG capsule Take 2 capsules (25 mg total) by mouth daily. 09/07/18   Arabella Merles, CNM  ibuprofen (ADVIL,MOTRIN) 600 MG tablet Take 1 tablet (600 mg total) by mouth every 6 (six) hours. 09/07/18   Cam Hai D, CNM   oxyCODONE (OXY IR/ROXICODONE) 5 MG immediate release tablet Take 1 tablet (5 mg total) by mouth every 4 (four) hours as needed for severe pain. 09/08/18   Constant, Peggy, MD  Prenatal Vit-Fe Fumarate-FA (PRENATAL COMPLETE) 14-0.4 MG TABS Take 1 tablet by mouth daily. 01/07/18   Dartha Lodge, PA-C  senna-docusate (SENOKOT-S) 8.6-50 MG tablet Take 2 tablets by mouth daily. 09/08/18   Arabella Merles, CNM      Allergies    Patient has no known allergies.    Review of Systems   Review of Systems  Constitutional:  Negative for chills and fever.  HENT:  Negative for congestion and sore throat.   Eyes:  Negative for visual disturbance.  Respiratory:  Positive for chest tightness and shortness of breath. Negative for cough and wheezing.   Cardiovascular:  Positive for chest pain. Negative for palpitations and leg swelling.  Gastrointestinal:  Negative for abdominal pain, nausea and vomiting.  Neurological:  Negative for dizziness, weakness and numbness.  All other systems reviewed and are negative.  Physical Exam Updated Vital Signs BP (!) 144/84   Pulse 82   Temp 98.3 F (36.8 C) (Oral)   Resp (!) 24   SpO2 100%  Physical Exam Vitals and nursing note reviewed.  Constitutional:      General: She is not in acute distress.    Appearance: Normal appearance. She is not ill-appearing, toxic-appearing or diaphoretic.  HENT:     Head: Normocephalic and atraumatic.     Nose: No nasal deformity.     Mouth/Throat:     Lips: Pink. No lesions.     Mouth: Mucous membranes are moist. No injury, lacerations, oral lesions or angioedema.     Pharynx: Oropharynx is clear. Uvula midline. No pharyngeal swelling, oropharyngeal exudate, posterior oropharyngeal erythema or uvula swelling.  Eyes:     General: Gaze aligned appropriately. No scleral icterus.       Right eye: No discharge.        Left eye: No discharge.     Conjunctiva/sclera: Conjunctivae normal.     Right eye: Right conjunctiva is not  injected. No exudate or hemorrhage.    Left eye: Left conjunctiva is not injected. No exudate or hemorrhage. Neck:     Vascular: No JVD.     Trachea: No tracheal deviation.  Cardiovascular:     Rate and Rhythm: Normal rate and regular rhythm.     Pulses: Normal pulses.          Radial pulses are 2+ on the right side and 2+ on the left side.       Dorsalis pedis pulses are 2+ on the right side and 2+ on the left side.     Heart sounds: Normal heart sounds, S1 normal and S2 normal. Heart sounds not distant. No murmur heard.   No friction rub. No gallop. No S3 or S4 sounds.  Pulmonary:  Effort: Pulmonary effort is normal. No accessory muscle usage or respiratory distress.     Breath sounds: No stridor. No decreased breath sounds, wheezing, rhonchi or rales.  Chest:     Chest wall: No tenderness.     Comments: No reproducible chest wall tenderness Abdominal:     General: Abdomen is flat. There is no distension.     Palpations: Abdomen is soft. There is no mass or pulsatile mass.     Tenderness: There is no abdominal tenderness. There is no guarding or rebound.  Musculoskeletal:     Right lower leg: No edema.     Left lower leg: No edema.  Skin:    General: Skin is warm and dry.     Coloration: Skin is not jaundiced or pale.     Findings: No bruising, erythema, lesion or rash.  Neurological:     General: No focal deficit present.     Mental Status: She is alert and oriented to person, place, and time.     GCS: GCS eye subscore is 4. GCS verbal subscore is 5. GCS motor subscore is 6.  Psychiatric:        Mood and Affect: Mood normal.        Behavior: Behavior normal. Behavior is cooperative.    ED Results / Procedures / Treatments   Labs (all labs ordered are listed, but only abnormal results are displayed) Labs Reviewed  BASIC METABOLIC PANEL - Abnormal; Notable for the following components:      Result Value   Glucose, Bld 207 (*)    Calcium 8.8 (*)    All other  components within normal limits  CBC - Abnormal; Notable for the following components:   HCT 35.5 (*)    All other components within normal limits  D-DIMER, QUANTITATIVE  BRAIN NATRIURETIC PEPTIDE  I-STAT BETA HCG BLOOD, ED ([facility], [facility], [facility] ONLY)  TROPONIN I (HIGH SENSITIVITY)  TROPONIN I (HIGH SENSITIVITY)    EKG None  Radiology DG Chest 2 View  Result Date: 04/01/2022 CLINICAL DATA:  Bilateral chest pain. EXAM: CHEST - 2 VIEW COMPARISON:  None Available. FINDINGS: The heart size and mediastinal contours are within normal limits. Both lungs are clear. The visualized skeletal structures are unremarkable. IMPRESSION: No active cardiopulmonary disease. Electronically Signed   By: Misty Stanley M.D.   On: 04/01/2022 06:20    Procedures Procedures  This patient was on telemetry or cardiac monitoring during their time in the ED.    Medications Ordered in ED Medications  ketorolac (TORADOL) 30 MG/ML injection 30 mg (has no administration in time range)    ED Course/ Medical Decision Making/ A&P   HEAR Score: 2                       Medical Decision Making Amount and/or Complexity of Data Reviewed Labs: ordered. Radiology: ordered.  Risk Prescription drug management.    MDM  This is a 41 y.o. female who presents to the ED with chest pain The differential of this patient includes but is not limited to ACS/NSTEMI, PE, PTX, GERD, MSK, Pericarditis, PNA  My Impression, Plan, and ED Course: Patient is oxygenating well on room air.  She is afebrile and have normal vitals.  Her exam is overall unremarkable.  I doubt that this is an aortic dissection or esophageal rupture.  He does have a history of DVT no pleuritic chest pain component, so we will obtain D-dimer, but she is overall low  risk for blood clots since the last one was provoked and she has no other risk factors other than obesity.  She cannot be PERC'ed out. Wells Score is 1.5. She does not have history of heart failure, but will  order BNP.  General chest pain workup initiated.   I personally ordered, reviewed, and interpreted all laboratory work and imaging and agree with radiologist interpretation. Results interpreted below: CBC unremarkable.  BMP does reveal a glucose of 207.  Minimally decreased calcium of 8.8.  She is not pregnant.  Her initial troponin is 4.  BNP is 11.4.  D-dimer is 0.27. EKG is normal sinus rhythm and is nonischemic.  No changes from baseline. Chest X-ray does not reveal any pulmonary edema, signs of pneumonia, no megaly or any signs that would indicate heart failure.  No pneumothorax.    Patient overall unremarkable work-up.  With negative D-dimer, I do not think she requires any more work-up for pulmonary embolism.  I also do not think she requires a second troponin as her chest pain started over 12 hours ago.  Been elevated at this point this was an acute MI.  Her HEART score is 2 indicating low risk for cardiac event in the next 30 days.  I have a low suspicion this is cardiac in etiology.  I did however offer her cardiology referral, but she declined this.  Symptoms seem more musculoskeletal in etiology as it is worse with sharp movements.  I recommended anti-inflammatories for several days as well as supportive treatments.  I recommended return precautions for worsening or new symptoms.  She was also found to have an elevated glucose of 207 and is not being treated for diabetes.  I think she would benefit from establishing care with PCP and getting tested for diabetes as she did have gestational diabetes putting her at higher risk for having diabetes now.  I also offered patient treatment for possible asthma flare, but she declines this at this time.  At this point, patient has remained stable and has normal vital signs.  Her pain is controlled.  I do not think she requires any further work-up or indications for admission, and that she is stable for discharge at this time.  Charting  Requirements Additional history is obtained from:  Independent historian External Records from outside source obtained and reviewed including: I reviewed prior labs or EKG Social Determinants of Health:  none Pertinant PMH that complicates patient's illness: hx of DVT  Patient Care Problems that were addressed during this visit: - Nonspecific chest pain: Acute illness with systemic symptoms This patient was maintained on a cardiac monitor/telemetry. I personally viewed and interpreted the cardiac monitor which reveals an underlying rhythm of NSR Medications given in ED: offered toradol and ibuprofen, but patient declines I have reviewed home medications and made changes accordingly.  Disposition: Discharge. F/u with PCP  Portions of this note were generated with Dragon dictation software. Dictation errors may occur despite best attempts at proofreading.    Final Clinical Impression(s) / ED Diagnoses Final diagnoses:  Atypical chest pain    Rx / DC Orders ED Discharge Orders     None         Adolphus Birchwood, PA-C 04/01/22 0911    Godfrey Pick, MD 04/01/22 1726

## 2022-04-01 NOTE — ED Notes (Signed)
Pt is VERY afraid of needles- would rather have an IV instead of IM, but agreed to IM-- then was very anxious before med given-- PA made aware.

## 2024-02-13 ENCOUNTER — Encounter: Payer: Self-pay | Admitting: Family Medicine

## 2024-02-13 ENCOUNTER — Other Ambulatory Visit

## 2024-02-13 ENCOUNTER — Ambulatory Visit (INDEPENDENT_AMBULATORY_CARE_PROVIDER_SITE_OTHER): Admitting: Family Medicine

## 2024-02-13 VITALS — BP 138/88 | HR 84 | Temp 98.2°F | Ht 64.0 in | Wt 257.8 lb

## 2024-02-13 DIAGNOSIS — Z1231 Encounter for screening mammogram for malignant neoplasm of breast: Secondary | ICD-10-CM

## 2024-02-13 DIAGNOSIS — Z1159 Encounter for screening for other viral diseases: Secondary | ICD-10-CM | POA: Diagnosis not present

## 2024-02-13 DIAGNOSIS — Z7985 Long-term (current) use of injectable non-insulin antidiabetic drugs: Secondary | ICD-10-CM | POA: Diagnosis not present

## 2024-02-13 DIAGNOSIS — J45909 Unspecified asthma, uncomplicated: Secondary | ICD-10-CM

## 2024-02-13 DIAGNOSIS — Z0000 Encounter for general adult medical examination without abnormal findings: Secondary | ICD-10-CM | POA: Diagnosis not present

## 2024-02-13 DIAGNOSIS — Z0001 Encounter for general adult medical examination with abnormal findings: Secondary | ICD-10-CM | POA: Diagnosis not present

## 2024-02-13 DIAGNOSIS — E1159 Type 2 diabetes mellitus with other circulatory complications: Secondary | ICD-10-CM

## 2024-02-13 DIAGNOSIS — Z13220 Encounter for screening for lipoid disorders: Secondary | ICD-10-CM

## 2024-02-13 DIAGNOSIS — Z114 Encounter for screening for human immunodeficiency virus [HIV]: Secondary | ICD-10-CM | POA: Diagnosis not present

## 2024-02-13 DIAGNOSIS — R7309 Other abnormal glucose: Secondary | ICD-10-CM

## 2024-02-13 DIAGNOSIS — I152 Hypertension secondary to endocrine disorders: Secondary | ICD-10-CM | POA: Diagnosis not present

## 2024-02-13 DIAGNOSIS — Z7689 Persons encountering health services in other specified circumstances: Secondary | ICD-10-CM

## 2024-02-13 DIAGNOSIS — Z131 Encounter for screening for diabetes mellitus: Secondary | ICD-10-CM

## 2024-02-13 LAB — CBC
HCT: 38.1 % (ref 36.0–46.0)
Hemoglobin: 12.5 g/dL (ref 12.0–15.0)
MCHC: 32.8 g/dL (ref 30.0–36.0)
MCV: 86.6 fl (ref 78.0–100.0)
Platelets: 251 10*3/uL (ref 150.0–400.0)
RBC: 4.4 Mil/uL (ref 3.87–5.11)
RDW: 14.3 % (ref 11.5–15.5)
WBC: 7.8 10*3/uL (ref 4.0–10.5)

## 2024-02-13 LAB — COMPREHENSIVE METABOLIC PANEL WITH GFR
ALT: 17 U/L (ref 0–35)
AST: 26 U/L (ref 0–37)
Albumin: 4.3 g/dL (ref 3.5–5.2)
Alkaline Phosphatase: 105 U/L (ref 39–117)
BUN: 10 mg/dL (ref 6–23)
CO2: 28 meq/L (ref 19–32)
Calcium: 9.3 mg/dL (ref 8.4–10.5)
Chloride: 100 meq/L (ref 96–112)
Creatinine, Ser: 0.87 mg/dL (ref 0.40–1.20)
GFR: 81.04 mL/min (ref >60.00)
Glucose, Bld: 288 mg/dL — ABNORMAL HIGH (ref 70–99)
Potassium: 4.2 meq/L (ref 3.5–5.1)
Sodium: 136 meq/L (ref 135–145)
Total Bilirubin: 0.3 mg/dL (ref 0.2–1.2)
Total Protein: 7.2 g/dL (ref 6.0–8.3)

## 2024-02-13 LAB — LIPID PANEL
Cholesterol: 155 mg/dL (ref 0–200)
HDL: 34 mg/dL — ABNORMAL LOW (ref >39.00)
NonHDL: 121.32
Total CHOL/HDL Ratio: 5
Triglycerides: 489 mg/dL — ABNORMAL HIGH (ref 0.0–149.0)
VLDL: 97.8 mg/dL — ABNORMAL HIGH (ref 0.0–40.0)

## 2024-02-13 LAB — TSH: TSH: 1.86 u[IU]/mL (ref 0.35–5.50)

## 2024-02-13 LAB — LDL CHOLESTEROL, DIRECT: Direct LDL: 73 mg/dL

## 2024-02-13 MED ORDER — ALBUTEROL SULFATE HFA 108 (90 BASE) MCG/ACT IN AERS: 2.0000 | INHALATION_SPRAY | RESPIRATORY_TRACT | 3 refills | Status: DC | PRN

## 2024-02-13 MED ORDER — FLUCONAZOLE 150 MG PO TABS: 150.0000 mg | ORAL_TABLET | Freq: Once | ORAL | 0 refills | Status: AC

## 2024-02-13 MED ORDER — CETIRIZINE HCL 10 MG PO TABS
10.0000 mg | ORAL_TABLET | Freq: Every day | ORAL | 3 refills | Status: DC
Start: 2024-02-13 — End: 2024-09-26

## 2024-02-13 MED ORDER — AMLODIPINE BESYLATE 5 MG PO TABS: 2.5000 mg | ORAL_TABLET | Freq: Every day | ORAL | 0 refills | Status: DC

## 2024-02-13 NOTE — Patient Instructions (Addendum)
 Return in about 2 weeks (around 02/27/2024) for HTN follow up.  Amlodipine 1/2 tab daily Zyrtec before bed to help with allergies and asthma         Great to see you today.  I have refilled the medication(s) we provide.   If labs were collected or images ordered, we will inform you of  results once we have received them and reviewed. We will contact you either by echart message, or telephone call.  Please give ample time to the testing facility, and our office to run,  receive and review results. Please do not call inquiring of results, even if you can see them in your chart. We will contact you as soon as we are able. If it has been over 1 week since the test was completed, and you have not yet heard from [place], then please call us.    - echart message- for normal results that have been seen by the patient already.   - telephone call: abnormal results or if patient has not viewed results in their echart.  If a referral to a specialist was entered for you, please call us in 2 weeks if you have not heard from the specialist office to schedule.

## 2024-02-13 NOTE — Progress Notes (Signed)
 Patient ID: Mariaguadalupe Fialkowski, female  DOB: Mar 18, 1981, 43 y.o.   MRN: [id number] Patient Care Team    Relationship Specialty Notifications Start End  Mariel Shope, DO PCP - General Family Medicine  02/13/24     Chief Complaint  Patient presents with   Establish Care    CPE.   No previous PCP.  Last PAP/Mammo over 5 years ago.  Possible referral to GYN      Subjective:  Anyely Cunning is a 43 y.o.  female present for new patient establishment. All past medical history, surgical history, allergies, family history, immunizations, medications and social history were updated in the electronic medical record today. All recent labs, ED visits and hospitalizations within the last year were reviewed.  Health maintenance:  Colonoscopy: No family history.  Routine screening to start at 43  Mammogram: No family history.  Mammogram ordered  Cervical cancer screening: last pap: Referral to gynecology placed Immunizations: tdap UTD 11/2017, Influenza  (encouraged yearly) Infectious disease screening: HIV completed during pregnancies, Hep C completed today DEXA: Routine screening Patient has a Dental home. Hospitalizations/ED visits:      No data to display             No data to display                 08/07/2018    3:00 PM  Fall Risk   Falls in the past year? No     Immunization History  Administered Date(s) Administered   Tdap 11/06/2017    No results found.  Past Medical History:  Diagnosis Date   Asthma    used inhaler 09/04/18; change in atmosphere and activity   Diabetes mellitus complicating pregnancy, antepartum 06/04/2018   Current Diabetic Medications:  None   [x]  Aspirin  81 mg daily after 12 weeks (= A2/B GDM)     Required Referrals for A1GDM or A2GDM:  [ ]  Diabetes Education and Testing Supplies  [ ]  Nutrition Cousult     For A2/B GDM or higher classes of DM  [ ]  Diabetes Education and Testing Supplies  [ ]  Nutrition Counsult  [ ]  Fetal ECHO after  20 weeks   [ ]  Eye exam for retina evaluation      Baseline and sur   Grand multipara 05/07/2018   History of pre-eclampsia 05/07/2018   History of preterm delivery 05/07/2018   Obesity    PIH (pregnancy induced hypertension) 09/04/2018   Supervision of high risk pregnancy, antepartum 05/07/2018              Nursing Staff    Provider      Office Location     [facility]     Dating            Language     ENGLISH     Anatomy US             Flu Vaccine          Genetic Screen     NIPS:   AFP:   First Screen:  Quad:          TDaP vaccine          Hgb A1C or   GTT    Early   Third trimester       Rhogam               LAB RESULTS       Feeding Plan    Breast  Blood Type    --/--/O POS  Performed at    No Known Allergies Past Surgical History:  Procedure Laterality Date   DILATION AND CURETTAGE OF UTERUS     Family History  Problem Relation Age of Onset   Diabetes Mother    Asthma Mother    Hypertension Mother    Stroke Mother    Diabetes Father    Heart disease Maternal Grandmother    Diabetes Maternal Grandmother    Hearing loss Maternal Grandfather    Social History   Social History Narrative   Marital status/children/pets: Married, [id number]   Education/employment: Graduated high school Works as a Optometrist:      -smoke alarm in the home:Yes     - wears seatbelt: Yes     - Feels safe in their relationships: Yes       Allergies as of 02/13/2024   No Known Allergies      Medication List        Accurate as of February 13, 2024 11:59 PM. If you have any questions, ask your nurse or doctor.          STOP taking these medications    Accu-Chek FastClix Lancets Misc Stopped by: Napolean Backbone   enoxaparin  40 MG/0.4ML injection Commonly known as: LOVENOX  Stopped by: Napolean Backbone   glucose blood test strip Commonly known as: Accu-Chek Guide Stopped by: Napolean Backbone   hydrochlorothiazide  12.5 MG capsule Commonly known as: MICROZIDE  Stopped by: Napolean Backbone    ibuprofen  600 MG tablet Commonly known as: ADVIL  Stopped by: Napolean Backbone   oxyCODONE  5 MG immediate release tablet Commonly known as: Oxy IR/ROXICODONE  Stopped by: Tabbetha Kutscher   Prenatal Complete 14-0.4 MG Tabs Stopped by: Napolean Backbone   senna-docusate 8.6-50 MG tablet Commonly known as: Senokot-S Stopped by: Napolean Backbone       TAKE these medications    albuterol  108 (90 Base) MCG/ACT inhaler Commonly known as: VENTOLIN  HFA Inhale 2 puffs into the lungs every 4 (four) hours as needed for wheezing or shortness of breath.   amLODipine  5 MG tablet Commonly known as: NORVASC  Take 0.5 tablets (2.5 mg total) by mouth daily. Started by: Napolean Backbone   aspirin  EC 81 MG tablet Take 1 tablet (81 mg total) by mouth daily. Take after 12 weeks for prevention of preeclampsia later in pregnancy   cetirizine  10 MG tablet Commonly known as: ZyrTEC  Allergy Take 1 tablet (10 mg total) by mouth daily. Started by: Napolean Backbone   fluconazole  150 MG tablet Commonly known as: DIFLUCAN  Take 1 tablet (150 mg total) by mouth once for 1 dose. Started by: Napolean Backbone        All past medical history, surgical history, allergies, family history, immunizations andmedications were updated in the EMR today and reviewed under the history and medication portions of their EMR.    Recent Results (from the past 2160 hours)  CBC     Status: None   Collection Time: 02/13/24 11:12 AM  Result Value Ref Range   WBC 7.8 4.0 - 10.5 K/uL   RBC 4.40 3.87 - 5.11 Mil/uL   Platelets 251.0 150.0 - 400.0 K/uL   Hemoglobin 12.5 12.0 - 15.0 g/dL   HCT 96.0 45.4 - 09.8 %   MCV 86.6 78.0 - 100.0 fl   MCHC 32.8 30.0 - 36.0 g/dL   RDW 11.9 14.7 - 82.9 %  Comp Met (CMET)     Status: Abnormal   Collection Time: 02/13/24  11:12 AM  Result Value Ref Range   Sodium 136 135 - 145 mEq/L   Potassium 4.2 3.5 - 5.1 mEq/L   Chloride 100 96 - 112 mEq/L   CO2 28 19 - 32 mEq/L   Glucose, Bld 288 (H) 70 - 99 mg/dL    BUN 10 6 - 23 mg/dL   Creatinine, Ser 1.61 0.40 - 1.20 mg/dL   Total Bilirubin 0.3 0.2 - 1.2 mg/dL   Alkaline Phosphatase 105 39 - 117 U/L   AST 26 0 - 37 U/L   ALT 17 0 - 35 U/L   Total Protein 7.2 6.0 - 8.3 g/dL   Albumin 4.3 3.5 - 5.2 g/dL   GFR 09.60 >45.40 mL/min    Comment: Calculated using the CKD-EPI Creatinine Equation (2021)   Calcium 9.3 8.4 - 10.5 mg/dL  TSH     Status: None   Collection Time: 02/13/24 11:12 AM  Result Value Ref Range   TSH 1.86 0.35 - 5.50 uIU/mL  Lipid panel     Status: Abnormal   Collection Time: 02/13/24 11:12 AM  Result Value Ref Range   Cholesterol 155 0 - 200 mg/dL    Comment: ATP III Classification       Desirable:  < 200 mg/dL               Borderline High:  200 - 239 mg/dL          High:  > = 981 mg/dL   Triglycerides (H) 0.0 - 149.0 mg/dL    191.4 Triglyceride is over 400; calculations on Lipids are invalid.    Comment: Normal:  <150 mg/dLBorderline High:  150 - 199 mg/dL   HDL 78.29 (L) >56.21 mg/dL   VLDL 30.8 (H) 0.0 - 65.7 mg/dL   Total CHOL/HDL Ratio 5     Comment:                Men          Women1/2 Average Risk     3.4          3.3Average Risk          5.0          4.42X Average Risk          9.6          7.13X Average Risk          15.0          11.0                       NonHDL 121.32     Comment: NOTE:  Non-HDL goal should be 30 mg/dL higher than patient's LDL goal (i.e. LDL goal of < 70 mg/dL, would have non-HDL goal of < 100 mg/dL)  Hepatitis C Antibody     Status: None   Collection Time: 02/13/24 11:12 AM  Result Value Ref Range   Hepatitis C Ab NON-REACTIVE NON-REACTIVE    Comment: . HCV antibody was non-reactive. There is no laboratory  evidence of HCV infection. . In most cases, no further action is required. However, if recent HCV exposure is suspected, a test for HCV RNA (test code 84696) is suggested. . For additional information please refer to http://education.[facility].com/faq/FAQ22v1 (This link is being  provided for informational/ educational purposes only.) .   LDL cholesterol, direct     Status: None   Collection Time: 02/13/24 11:12 AM  Result Value Ref Range   Direct  LDL 73.0 mg/dL    Comment: Optimal:  <161 mg/dLNear or Above Optimal:  100-129 mg/dLBorderline High:  130-159 mg/dLHigh:  160-189 mg/dLVery High:  >190 mg/dL  Hemoglobin W9U     Status: Abnormal   Collection Time: 02/13/24  4:35 PM  Result Value Ref Range   Hgb A1c MFr Bld 10.9 (H) 4.8 - 5.6 %    Comment:          Prediabetes: 5.7 - 6.4          Diabetes: >6.4          Glycemic control for adults with diabetes: <7.0    Est. average glucose Bld gHb Est-mCnc 266 mg/dL      ROS 14 pt review of systems performed and negative (unless mentioned in an HPI)  Objective: BP 138/88   Pulse 84   Temp 98.2 F (36.8 C)   Ht 5\' 4"  (1.626 m)   Wt 257 lb 12.8 oz (116.9 kg)   LMP 11/19/2023   SpO2 98%   BMI 44.25 kg/m  Physical Exam Vitals and nursing note reviewed.  Constitutional:      General: She is not in acute distress.    Appearance: Normal appearance. She is obese. She is not ill-appearing or toxic-appearing.  HENT:     Head: Normocephalic and atraumatic.     Right Ear: Tympanic membrane, ear canal and external ear normal. There is no impacted cerumen.     Left Ear: Tympanic membrane, ear canal and external ear normal. There is no impacted cerumen.     Nose: No congestion or rhinorrhea.     Mouth/Throat:     Mouth: Mucous membranes are moist.     Pharynx: Oropharynx is clear. No oropharyngeal exudate or posterior oropharyngeal erythema.  Eyes:     General: No scleral icterus.       Right eye: No discharge.        Left eye: No discharge.     Extraocular Movements: Extraocular movements intact.     Conjunctiva/sclera: Conjunctivae normal.     Pupils: Pupils are equal, round, and reactive to light.  Cardiovascular:     Rate and Rhythm: Normal rate and regular rhythm.     Pulses: Normal pulses.     Heart  sounds: Normal heart sounds. No murmur heard.    No friction rub. No gallop.  Pulmonary:     Effort: Pulmonary effort is normal. No respiratory distress.     Breath sounds: Normal breath sounds. No stridor. No wheezing, rhonchi or rales.  Chest:     Chest wall: No tenderness.  Abdominal:     General: Abdomen is flat. Bowel sounds are normal. There is no distension.     Palpations: Abdomen is soft. There is no mass.     Tenderness: There is no abdominal tenderness. There is no right CVA tenderness, left CVA tenderness, guarding or rebound.     Hernia: No hernia is present.  Musculoskeletal:        General: No swelling, tenderness or deformity. Normal range of motion.     Cervical back: Normal range of motion and neck supple. No rigidity or tenderness.     Right lower leg: No edema.     Left lower leg: No edema.  Lymphadenopathy:     Cervical: No cervical adenopathy.  Skin:    General: Skin is warm and dry.     Coloration: Skin is not jaundiced or pale.     Findings: No bruising, erythema,  lesion or rash.  Neurological:     General: No focal deficit present.     Mental Status: She is alert and oriented to person, place, and time. Mental status is at baseline.     Cranial Nerves: No cranial nerve deficit.     Sensory: No sensory deficit.     Motor: No weakness.     Coordination: Coordination normal.     Gait: Gait normal.     Deep Tendon Reflexes: Reflexes normal.  Psychiatric:        Mood and Affect: Mood normal.        Behavior: Behavior normal.        Thought Content: Thought content normal.        Judgment: Judgment normal.      Assessment/plan: Cabrini Ruggieri is a 42 y.o. female present for cpe/est appt Establishing care with new doctor, encounter for Breast cancer screening by mammogram - MM 3D SCREENING MAMMOGRAM BILATERAL BREAST Encounter for hepatitis C screening test for low risk patient - Hepatitis C Antibody Diabetes mellitus screening/morbid obesity -  Hemoglobin A1c Lipid screening/morbid obesity - Lipid panel Establishing care with new doctor, encounter for Patient overdue for Pap - Ambulatory referral to Gynecology Mild asthma, unspecified whether complicated, unspecified whether persistent She is uncertain the name of her daily inhaler, she will check and get back to us  Start Zyrtec  nightly Continue nebs as needed Hypertension: Above goal Start amlodipine  2.5 mg daily Routine general medical examination at a health care facility (Primary) - CBC - Comp Met (CMET) - TSH Patient was encouraged to exercise greater than 150 minutes a week. Patient was encouraged to choose a diet filled with fresh fruits and vegetables, and lean meats. AVS provided to patient today for education/recommendation on gender specific health and safety maintenance. Colonoscopy: No family history.  Routine screening to start at 43  Mammogram: No family history.  Mammogram ordered  Cervical cancer screening: last pap: Referral to gynecology placed Immunizations: tdap UTD 11/2017, Influenza  (encouraged yearly) Infectious disease screening: HIV completed during pregnancies, Hep C completed today DEXA: Routine screening   Return in about 2 weeks (around 02/27/2024) for HTN follow up.  Orders Placed This Encounter  Procedures   MM 3D SCREENING MAMMOGRAM BILATERAL BREAST   CBC   Comp Met (CMET)   TSH   Lipid panel   Hemoglobin A1c   Hepatitis C Antibody   LDL cholesterol, direct   Ambulatory referral to Gynecology   Meds ordered this encounter  Medications   albuterol  (VENTOLIN  HFA) 108 (90 Base) MCG/ACT inhaler    Sig: Inhale 2 puffs into the lungs every 4 (four) hours as needed for wheezing or shortness of breath.    Dispense:  1 each    Refill:  3   amLODipine  (NORVASC ) 5 MG tablet    Sig: Take 0.5 tablets (2.5 mg total) by mouth daily.    Dispense:  90 tablet    Refill:  0   fluconazole  (DIFLUCAN ) 150 MG tablet    Sig: Take 1 tablet (150 mg  total) by mouth once for 1 dose.    Dispense:  1 tablet    Refill:  0   cetirizine  (ZYRTEC  ALLERGY) 10 MG tablet    Sig: Take 1 tablet (10 mg total) by mouth daily.    Dispense:  90 tablet    Refill:  3   Referral Orders         Ambulatory referral to Gynecology  Note is dictated utilizing voice recognition software. Although note has been proof read prior to signing, occasional typographical errors still can be missed. If any questions arise, please do not hesitate to call for verification.  Electronically signed by: Napolean Backbone, DO [facility] [place]

## 2024-02-14 ENCOUNTER — Telehealth: Payer: Self-pay

## 2024-02-14 ENCOUNTER — Other Ambulatory Visit: Payer: Self-pay | Admitting: Family Medicine

## 2024-02-14 DIAGNOSIS — E1165 Type 2 diabetes mellitus with hyperglycemia: Secondary | ICD-10-CM | POA: Insufficient documentation

## 2024-02-14 DIAGNOSIS — E1159 Type 2 diabetes mellitus with other circulatory complications: Secondary | ICD-10-CM | POA: Insufficient documentation

## 2024-02-14 DIAGNOSIS — E781 Pure hyperglyceridemia: Secondary | ICD-10-CM | POA: Insufficient documentation

## 2024-02-14 LAB — HEPATITIS C ANTIBODY: Hepatitis C Ab: NONREACTIVE

## 2024-02-14 LAB — HEMOGLOBIN A1C
Est. average glucose Bld gHb Est-mCnc: 266 mg/dL
Hgb A1c MFr Bld: 10.9 % — ABNORMAL HIGH (ref 4.8–5.6)

## 2024-02-14 MED ORDER — OZEMPIC (0.25 OR 0.5 MG/DOSE) 2 MG/3ML ~~LOC~~ SOPN: 0.2500 mg | PEN_INJECTOR | SUBCUTANEOUS | 0 refills | Status: DC

## 2024-02-14 NOTE — Telephone Encounter (Signed)
 Pharmacy Patient Advocate Encounter   Received notification from CoverMyMeds that prior authorization for Ozempic (0.25 or 0.5 MG/DOSE) 2MG /3ML pen-injectors is required/requested.   Insurance verification completed.   The patient is insured through [facility] .   Per test claim: PA required; PA submitted to above mentioned insurance via CoverMyMeds Key/confirmation #/EOC [id number] Status is pending

## 2024-02-18 ENCOUNTER — Telehealth: Payer: Self-pay | Admitting: Family Medicine

## 2024-02-18 NOTE — Telephone Encounter (Signed)
 Pt given results/recommendations.

## 2024-02-18 NOTE — Telephone Encounter (Signed)
 Please call the pt with prior results. Do not mychart them. Pt must be reached

## 2024-02-26 ENCOUNTER — Encounter: Payer: Self-pay | Admitting: Family Medicine

## 2024-02-28 ENCOUNTER — Ambulatory Visit: Admitting: Family Medicine

## 2024-02-28 NOTE — Progress Notes (Signed)
   No show new onset DM appt. 1st occurance No refills will be provided on Ozempic  starter until pt is seen by provider.

## 2024-03-06 ENCOUNTER — Ambulatory Visit

## 2024-03-13 ENCOUNTER — Encounter (HOSPITAL_COMMUNITY): Payer: Self-pay

## 2024-03-18 ENCOUNTER — Ambulatory Visit: Admitting: Family Medicine

## 2024-03-20 ENCOUNTER — Ambulatory Visit

## 2024-03-21 ENCOUNTER — Other Ambulatory Visit: Payer: Self-pay

## 2024-03-21 ENCOUNTER — Encounter: Payer: Self-pay | Admitting: Family Medicine

## 2024-03-21 ENCOUNTER — Ambulatory Visit (INDEPENDENT_AMBULATORY_CARE_PROVIDER_SITE_OTHER): Admitting: Family Medicine

## 2024-03-21 VITALS — BP 142/84 | HR 90 | Temp 98.0°F | Wt 250.4 lb

## 2024-03-21 DIAGNOSIS — E1159 Type 2 diabetes mellitus with other circulatory complications: Secondary | ICD-10-CM | POA: Diagnosis not present

## 2024-03-21 DIAGNOSIS — K047 Periapical abscess without sinus: Secondary | ICD-10-CM

## 2024-03-21 DIAGNOSIS — E1165 Type 2 diabetes mellitus with hyperglycemia: Secondary | ICD-10-CM

## 2024-03-21 DIAGNOSIS — E781 Pure hyperglyceridemia: Secondary | ICD-10-CM | POA: Diagnosis not present

## 2024-03-21 DIAGNOSIS — J45909 Unspecified asthma, uncomplicated: Secondary | ICD-10-CM

## 2024-03-21 DIAGNOSIS — I152 Hypertension secondary to endocrine disorders: Secondary | ICD-10-CM

## 2024-03-21 LAB — BASIC METABOLIC PANEL WITH GFR
BUN: 12 mg/dL (ref 6–23)
CO2: 30 meq/L (ref 19–32)
Calcium: 9.4 mg/dL (ref 8.4–10.5)
Chloride: 100 meq/L (ref 96–112)
Creatinine, Ser: 0.97 mg/dL (ref 0.40–1.20)
GFR: 71.07 mL/min (ref >60.00)
Glucose, Bld: 270 mg/dL — ABNORMAL HIGH (ref 70–99)
Potassium: 4.4 meq/L (ref 3.5–5.1)
Sodium: 137 meq/L (ref 135–145)

## 2024-03-21 LAB — MICROALBUMIN / CREATININE URINE RATIO
Creatinine,U: 226.4 mg/dL
Microalb Creat Ratio: 4.9 mg/g (ref 0.0–30.0)
Microalb, Ur: 1.1 mg/dL (ref 0.0–1.9)

## 2024-03-21 MED ORDER — ALBUTEROL SULFATE HFA 108 (90 BASE) MCG/ACT IN AERS: 2.0000 | INHALATION_SPRAY | RESPIRATORY_TRACT | 11 refills | Status: DC | PRN

## 2024-03-21 MED ORDER — SEMAGLUTIDE (1 MG/DOSE) 4 MG/3ML ~~LOC~~ SOPN: 1.0000 mg | PEN_INJECTOR | SUBCUTANEOUS | 2 refills | Status: DC

## 2024-03-21 MED ORDER — CHLORHEXIDINE GLUCONATE 0.12 % MT SOLN: 15.0000 mL | Freq: Two times a day (BID) | OROMUCOSAL | 1 refills | Status: AC

## 2024-03-21 MED ORDER — AMLODIPINE BESYLATE 5 MG PO TABS: 5.0000 mg | ORAL_TABLET | Freq: Every day | ORAL | 1 refills | Status: DC

## 2024-03-21 MED ORDER — BLOOD GLUCOSE MONITOR KIT
PACK | 0 refills | Status: AC
Start: 2024-03-21 — End: ?

## 2024-03-21 MED ORDER — AMOXICILLIN-POT CLAVULANATE 875-125 MG PO TABS
1.0000 | ORAL_TABLET | Freq: Two times a day (BID) | ORAL | 0 refills | Status: DC
Start: 2024-03-21 — End: 2024-09-26

## 2024-03-21 MED ORDER — BLOOD GLUCOSE MONITOR KIT: PACK | 0 refills | Status: DC

## 2024-03-21 MED ORDER — FLUCONAZOLE 150 MG PO TABS: 150.0000 mg | ORAL_TABLET | Freq: Once | ORAL | 0 refills | Status: AC

## 2024-03-21 NOTE — Patient Instructions (Signed)
 Return in about 2 months (around 06/02/2024).        Great to see you today.  I have refilled the medication(s) we provide.   If labs were collected or images ordered, we will inform you of  results once we have received them and reviewed. We will contact you either by echart message, or telephone call.  Please give ample time to the testing facility, and our office to run,  receive and review results. Please do not call inquiring of results, even if you can see them in your chart. We will contact you as soon as we are able. If it has been over 1 week since the test was completed, and you have not yet heard from us , then please call us .    - echart message- for normal results that have been seen by the patient already.   - telephone call: abnormal results or if patient has not viewed results in their echart.  If a referral to a specialist was entered for you, please call us  in 2 weeks if you have not heard from the specialist office to schedule.

## 2024-03-21 NOTE — Progress Notes (Signed)
 Amy Reynolds , 1981-01-11, 43 y.o., female MRN: [id number] Patient Care Team    Relationship Specialty Notifications Start End  Mariel Shope, DO PCP - General Family Medicine  02/13/24     Chief Complaint  Patient presents with   Hypertension     Subjective: Amy Reynolds is a 43 y.o. Pt presents for an OV  Mild asthma, unspecified whether complicated, unspecified whether persistent She is uncertain the name of her daily inhaler, she will check and get back to us  Using albuterol  as needed  Hypertension: Patient reports compliance with amlodipine  2.5 mg daily. Patient denies chest pain, shortness of breath, dizziness or lower extremity edema.   New onset DM: A1c 10.9 at dx 02/2024: Pt reports compliance with Ozempic  0.25 mg weekly injection. Denies numbness, tingling of extremities, hypo/hyperglycemic events or non-healing wounds.  She is tolerating start medication Down 7lbs.   Dental pain: Patient reports she has dental pain on the left lower aspect of her jaw.  She states she has been in contact with her dentist who said they could not do anything and felt the abscess was cleared, but did not evaluate her for an abscess.      No data to display          No Known Allergies Social History   Social History Narrative   Marital status/children/pets: Married, [id number]   Education/employment: Graduated high school Works as a Optometrist:      -smoke alarm in the home:Yes     - wears seatbelt: Yes     - Feels safe in their relationships: Yes      Past Medical History:  Diagnosis Date   Asthma    used inhaler 09/04/18; change in atmosphere and activity   Diabetes mellitus complicating pregnancy, antepartum 06/04/2018   Current Diabetic Medications:  None   [x]  Aspirin  81 mg daily after 12 weeks (= A2/B GDM)     Required Referrals for A1GDM or A2GDM:  [ ]  Diabetes Education and Testing Supplies  [ ]  Nutrition Cousult     For A2/B GDM or higher  classes of DM  [ ]  Diabetes Education and Testing Supplies  [ ]  Nutrition Counsult  [ ]  Fetal ECHO after 20 weeks   [ ]  Eye exam for retina evaluation      Baseline and sur   Grand multipara 05/07/2018   History of pre-eclampsia 05/07/2018   History of preterm delivery 05/07/2018   Obesity    PIH (pregnancy induced hypertension) 09/04/2018   Supervision of high risk pregnancy, antepartum 05/07/2018              Nursing Staff    Provider      Office Location     [facility]     Dating            Language     ENGLISH     Anatomy US             Flu Vaccine          Genetic Screen     NIPS:   AFP:   First Screen:  Quad:          TDaP vaccine          Hgb A1C or   GTT    Early   Third trimester       Rhogam               LAB RESULTS  Feeding Plan    Breast    Blood Type    --/--/O POS  Performed at    Past Surgical History:  Procedure Laterality Date   DILATION AND CURETTAGE OF UTERUS     Family History  Problem Relation Age of Onset   Diabetes Mother    Asthma Mother    Hypertension Mother    Stroke Mother    Diabetes Father    Heart disease Maternal Grandmother    Diabetes Maternal Grandmother    Hearing loss Maternal Grandfather    Allergies as of 03/21/2024   No Known Allergies      Medication List        Accurate as of Mar 21, 2024  3:22 PM. If you have any questions, ask your nurse or doctor.          STOP taking these medications    Ozempic  (0.25 or 0.5 MG/DOSE) 2 MG/3ML Sopn Generic drug: Semaglutide (0.25 or 0.5MG /DOS) Replaced by: Semaglutide  (1 MG/DOSE) 4 MG/3ML Sopn Stopped by: Napolean Backbone       TAKE these medications    albuterol  108 (90 Base) MCG/ACT inhaler Commonly known as: VENTOLIN  HFA Inhale 2 puffs into the lungs every 4 (four) hours as needed for wheezing or shortness of breath.   amLODipine  5 MG tablet Commonly known as: NORVASC  Take 1 tablet (5 mg total) by mouth daily. What changed: how much to take Changed by: Orange Hilligoss    amoxicillin -clavulanate 875-125 MG tablet Commonly known as: AUGMENTIN  Take 1 tablet by mouth 2 (two) times daily. Started by: Napolean Backbone   aspirin  EC 81 MG tablet Take 1 tablet (81 mg total) by mouth daily. Take after 12 weeks for prevention of preeclampsia later in pregnancy   blood glucose meter kit and supplies Kit Check fasting glucose once daily. Started by: CMA Chloe C   cetirizine  10 MG tablet Commonly known as: ZyrTEC  Allergy Take 1 tablet (10 mg total) by mouth daily.   chlorhexidine 0.12 % solution Commonly known as: PERIDEX Use as directed 15 mLs in the mouth or throat 2 (two) times daily. Started by: Napolean Backbone   fluconazole  150 MG tablet Commonly known as: DIFLUCAN  Take 1 tablet (150 mg total) by mouth once for 1 dose. After completion of antibiotics. Started by: Linzee Depaul   Semaglutide  (1 MG/DOSE) 4 MG/3ML Sopn Inject 1 mg as directed once a week. Start taking on: April 11, 2024 Replaces: Ozempic  (0.25 or 0.5 MG/DOSE) 2 MG/3ML Sopn Started by: Napolean Backbone        All past medical history, surgical history, allergies, family history, immunizations andmedications were updated in the EMR today and reviewed under the history and medication portions of their EMR.     ROS Negative, with the exception of above mentioned in HPI   Objective:  BP (!) 142/84   Pulse 90   Temp 98 F (36.7 C)   Wt 250 lb 6.4 oz (113.6 kg)   SpO2 98%   BMI 42.98 kg/m  Body mass index is 42.98 kg/m. Physical Exam Vitals and nursing note reviewed.  Constitutional:      General: She is not in acute distress.    Appearance: Normal appearance. She is not ill-appearing, toxic-appearing or diaphoretic.  HENT:     Head: Normocephalic and atraumatic.  Eyes:     General: No scleral icterus.       Right eye: No discharge.        Left eye: No discharge.  Extraocular Movements: Extraocular movements intact.     Conjunctiva/sclera: Conjunctivae normal.     Pupils: Pupils  are equal, round, and reactive to light.  Cardiovascular:     Rate and Rhythm: Normal rate and regular rhythm.     Heart sounds: No murmur heard. Pulmonary:     Effort: Pulmonary effort is normal. No respiratory distress.     Breath sounds: Normal breath sounds. No wheezing, rhonchi or rales.  Musculoskeletal:     Cervical back: Neck supple.     Right lower leg: No edema.     Left lower leg: No edema.  Skin:    General: Skin is warm.     Findings: No rash.  Neurological:     Mental Status: She is alert and oriented to person, place, and time. Mental status is at baseline.     Motor: No weakness.     Gait: Gait normal.  Psychiatric:        Mood and Affect: Mood normal.        Behavior: Behavior normal.        Thought Content: Thought content normal.        Judgment: Judgment normal.     No results found. No results found. No results found for this or any previous visit (from the past 24 hours).  Assessment/Plan: Amy Reynolds is a 43 y.o. female present for OV for  Mild asthma, unspecified whether complicated, unspecified whether persistent She is uncertain the name of her daily inhaler, she will check and get back to us  Continue Zyrtec  nightly Continue nebs as needed  Hypertension: Still above goal Increase amlodipine  2.5 mg to 5 mg daily Low-sodium diet  Diabetes: New onset Will offer pneumonia vaccine next visit Flu shot:  (recommneded yearly) BMP: Collected today after start of Ozempic  Foot exam: Completed today 03/21/2024 Eye exam: Referral placed today 03/21/2024-yearly eye exams encouraged Urine microalbumin collected 03/21/2024 Increase Ozempic  to 0.5 mg weekly injection-sample provided for 4 weeks.,  Then increase to Ozempic  1 mg weekly injection prescription written Glucose monitoring and supplies prescribed.  Patient encouraged to start fasting glucoses every morning. Follow diabetic diet A1c: 10.9> Due 7/9 for repeat  Dental pain: Prescribed  amoxicillin  twice daily x 10 days and the Diflucan  to have on hand after completing antibiotic if needed for yeast infection. Encouraged her to follow-up with her dentist, cannot continue to provide antibiotics for possible dental infection without a dental exam.  Dentist should be examining patient to complete x-rays and diagnose patient appropriately.  Reviewed expectations re: course of current medical issues. Discussed self-management of symptoms. Outlined signs and symptoms indicating need for more acute intervention. Patient verbalized understanding and all questions were answered. Patient received an After-Visit Summary.    Orders Placed This Encounter  Procedures   Urine Microalbumin w/creat. ratio   Basic Metabolic Panel (BMET)   Ambulatory referral to Ophthalmology   Meds ordered this encounter  Medications   amLODipine  (NORVASC ) 5 MG tablet    Sig: Take 1 tablet (5 mg total) by mouth daily.    Dispense:  90 tablet    Refill:  1   Semaglutide , 1 MG/DOSE, 4 MG/3ML SOPN    Sig: Inject 1 mg as directed once a week.    Dispense:  3 mL    Refill:  2   albuterol  (VENTOLIN  HFA) 108 (90 Base) MCG/ACT inhaler    Sig: Inhale 2 puffs into the lungs every 4 (four) hours as needed for wheezing or shortness of  breath.    Dispense:  1 each    Refill:  11   amoxicillin -clavulanate (AUGMENTIN ) 875-125 MG tablet    Sig: Take 1 tablet by mouth 2 (two) times daily.    Dispense:  20 tablet    Refill:  0   chlorhexidine (PERIDEX) 0.12 % solution    Sig: Use as directed 15 mLs in the mouth or throat 2 (two) times daily.    Dispense:  120 mL    Refill:  1   fluconazole  (DIFLUCAN ) 150 MG tablet    Sig: Take 1 tablet (150 mg total) by mouth once for 1 dose. After completion of antibiotics.    Dispense:  1 tablet    Refill:  0   DISCONTD: blood glucose meter kit and supplies KIT    Sig: Check fasting glucose once daily.    Dispense:  1 each    Refill:  0    Number of strips:   200     Number of lancets:   200   Referral Orders         Ambulatory referral to Ophthalmology       Note is dictated utilizing voice recognition software. Although note has been proof read prior to signing, occasional typographical errors still can be missed. If any questions arise, please do not hesitate to call for verification.   electronically signed by:  Napolean Backbone, DO  [facility] [place]

## 2024-03-24 ENCOUNTER — Ambulatory Visit: Payer: Self-pay | Admitting: Family Medicine

## 2024-03-24 NOTE — Telephone Encounter (Signed)
 Pharmacy Patient Advocate Encounter  Received notification from [employer] that Prior Authorization for Ozempic  (0.25 or 0.5 MG/DOSE) 2MG /3ML pen-injectors has been APPROVED from 02/14/2024 to 02/13/2025   PA #/Case ID/Reference #: [id number]

## 2024-06-02 ENCOUNTER — Encounter: Payer: Self-pay | Admitting: Family Medicine

## 2024-06-02 ENCOUNTER — Ambulatory Visit (INDEPENDENT_AMBULATORY_CARE_PROVIDER_SITE_OTHER): Payer: Self-pay | Admitting: Family Medicine

## 2024-06-02 DIAGNOSIS — Z91199 Patient's noncompliance with other medical treatment and regimen due to unspecified reason: Secondary | ICD-10-CM

## 2024-06-02 NOTE — Progress Notes (Signed)
 No show

## 2024-06-12 ENCOUNTER — Ambulatory Visit
Admission: RE | Admit: 2024-06-12 | Discharge: 2024-06-12 | Disposition: A | Discharge status: Home / Self Care | Source: Ambulatory Visit | Attending: Family Medicine | Admitting: Family Medicine

## 2024-06-12 ENCOUNTER — Other Ambulatory Visit: Payer: Self-pay | Admitting: Family Medicine

## 2024-06-12 MED ORDER — SEMAGLUTIDE (1 MG/DOSE) 4 MG/3ML ~~LOC~~ SOPN: 1.0000 mg | PEN_INJECTOR | SUBCUTANEOUS | 0 refills | Status: DC

## 2024-06-12 NOTE — Telephone Encounter (Signed)
 Copied from CRM [id number]. Topic: Clinical - Medication Refill >> Jun 12, 2024 11:10 AM Berneda FALCON wrote: Medication:  Semaglutide , 1 MG/DOSE, 4 MG/3ML SOPN  Needs to see if she wants to increase the dose or not, she is now at 1 MG right now  Has the patient contacted their pharmacy? Yes (Agent: If no, request that the patient contact the pharmacy for the refill. If patient does not wish to contact the pharmacy document the reason why and proceed with request.) (Agent: If yes, when and what did the pharmacy advise?)  This is the patient's preferred pharmacy:  [id number] - [place], [place] - [address] AT [facility] OF [address] [place] [address] Phone: [phone] Fax: [phone]  Is this the correct pharmacy for this prescription? Yes If no, delete pharmacy and type the correct one.   Has the prescription been filled recently? No  Is the patient out of the medication? No, took last dose yesterday  Has the patient been seen for an appointment in the last year OR does the patient have an upcoming appointment? Yes  Can we respond through MyChart? Yes  Agent: Please be advised that Rx refills may take up to 3 business days. We ask that you follow-up with your pharmacy.

## 2024-06-16 ENCOUNTER — Ambulatory Visit: Payer: Self-pay | Admitting: Family Medicine

## 2024-06-16 NOTE — Progress Notes (Signed)
   Same day cancel

## 2024-06-23 ENCOUNTER — Encounter: Payer: Self-pay | Admitting: Family Medicine

## 2024-06-23 ENCOUNTER — Ambulatory Visit: Payer: Self-pay | Admitting: Family Medicine

## 2024-06-23 NOTE — Progress Notes (Signed)
 No show

## 2024-08-13 ENCOUNTER — Other Ambulatory Visit: Payer: Self-pay | Admitting: Family Medicine

## 2024-08-13 NOTE — Telephone Encounter (Signed)
 Copied from CRM [id number]. Topic: Clinical - Medication Refill >> Aug 13, 2024 11:00 AM Rea C wrote: Medication: Semaglutide , 1 MG/DOSE, 4 MG/3ML SOPN  Has the patient contacted their pharmacy? Yes and they dont have it.  (Agent: If no, request that the patient contact the pharmacy for the refill. If patient does not wish to contact the pharmacy document the reason why and proceed with request.) (Agent: If yes, when and what did the pharmacy advise?)  This is the patient's preferred pharmacy:  [id number] - [place], [place] - [address] AT [facility] OF [address]  [place] [address] Phone: [phone] Fax: [phone]  Is this the correct pharmacy for this prescription? Yes If no, delete pharmacy and type the correct one.   Has the prescription been filled recently? No  Is the patient out of the medication? Yes  Has the patient been seen for an appointment in the last year OR does the patient have an upcoming appointment? Yes  Can we respond through MyChart? Yes  Agent: Please be advised that Rx refills may take up to 3 business days. We ask that you follow-up with your pharmacy.

## 2024-08-13 NOTE — Telephone Encounter (Signed)
 Copied from CRM #[id number]. Topic: Appointments - Scheduling Inquiry for Clinic >> Aug 13, 2024 11:03 AM [name] wrote: Reason for CRM: Patient was calling to schedule an appointment with Dr. Catherine. EPIC template shows that patient was discharged from practice. I advised that she had a cancellation and then a no show in 08/18. Patient said that she was unaware of the appt on 08/18. Patient is asking if she can be a patient again with Dr. Kuneff? Patient is concerned because she has been taking the Ozempic . Patient stated that she did not receive any notice and that this is new to her with being discharged from the clinic. Patient would like to receive a call to discuss.   [phone] (M)  Called patient to discuss any questions or concerns pertaining to dismissal. Patient was dismissed per no show policy. Letters were sent via Mychart and mail in reference to no show policy/dismissal.

## 2024-09-26 ENCOUNTER — Telehealth: Payer: Self-pay

## 2024-09-26 ENCOUNTER — Ambulatory Visit: Payer: Self-pay | Admitting: Family Medicine

## 2024-09-26 ENCOUNTER — Other Ambulatory Visit (HOSPITAL_COMMUNITY): Payer: Self-pay

## 2024-09-26 ENCOUNTER — Encounter: Payer: Self-pay | Admitting: Family Medicine

## 2024-09-26 VITALS — BP 134/86 | HR 83 | Wt 246.6 lb

## 2024-09-26 DIAGNOSIS — E1159 Type 2 diabetes mellitus with other circulatory complications: Secondary | ICD-10-CM

## 2024-09-26 DIAGNOSIS — Z0000 Encounter for general adult medical examination without abnormal findings: Secondary | ICD-10-CM | POA: Diagnosis not present

## 2024-09-26 DIAGNOSIS — E1165 Type 2 diabetes mellitus with hyperglycemia: Secondary | ICD-10-CM | POA: Diagnosis not present

## 2024-09-26 DIAGNOSIS — Z7984 Long term (current) use of oral hypoglycemic drugs: Secondary | ICD-10-CM

## 2024-09-26 DIAGNOSIS — I152 Hypertension secondary to endocrine disorders: Secondary | ICD-10-CM

## 2024-09-26 DIAGNOSIS — J45909 Unspecified asthma, uncomplicated: Secondary | ICD-10-CM | POA: Diagnosis not present

## 2024-09-26 LAB — POCT GLYCOSYLATED HEMOGLOBIN (HGB A1C): Hemoglobin A1C: 8.2 % — AB (ref 4.0–5.6)

## 2024-09-26 LAB — LIPID PANEL

## 2024-09-26 MED ORDER — TIRZEPATIDE 2.5 MG/0.5ML ~~LOC~~ SOAJ: 2.5000 mg | SUBCUTANEOUS | 0 refills | Status: DC

## 2024-09-26 MED ORDER — CETIRIZINE HCL 10 MG PO TABS: 10.0000 mg | ORAL_TABLET | Freq: Every day | ORAL | 3 refills | Status: AC

## 2024-09-26 MED ORDER — LISINOPRIL 20 MG PO TABS: 20.0000 mg | ORAL_TABLET | Freq: Every day | ORAL | 3 refills | Status: AC

## 2024-09-26 MED ORDER — METFORMIN HCL 1000 MG PO TABS: 1000.0000 mg | ORAL_TABLET | Freq: Two times a day (BID) | ORAL | 1 refills | Status: AC

## 2024-09-26 MED ORDER — ALBUTEROL SULFATE HFA 108 (90 BASE) MCG/ACT IN AERS: 2.0000 | INHALATION_SPRAY | RESPIRATORY_TRACT | 11 refills | Status: AC | PRN

## 2024-09-26 NOTE — Progress Notes (Unsigned)
 Name: Rosemaria Inabinet   Date of Visit: 09/26/24   Date of last visit with me: Visit date not found   CHIEF COMPLAINT:  Chief Complaint  Patient presents with   Establish Care    New patient.        HPI:  Discussed the use of AI scribe software for clinical note transcription with the patient, who gave verbal consent to proceed.  History of Present Illness   Shaliyah Taite is a 43 year old female with type 2 diabetes and hypertension who presents for medication management. She is accompanied by her husband.  She is experiencing difficulties in managing her type 2 diabetes due to issues with medication refills. She was previously prescribed Ozempic  for diabetes management and has been monitoring her blood sugar levels at home. Her blood sugar levels have been high, with a recent reading of ten. She has not been able to refill her medications due to a change in her physician, which has led to a lapse in her diabetes management. She wants to avoid insulin therapy.  She has a history of hypertension and was previously taking amlodipine . She is currently unsure about her blood pressure medication regimen due to the change in her healthcare provider. She also uses albuterol  as needed for respiratory issues and has only one inhaler left. Additionally, she takes Zyrtec  daily for allergies but has been unable to refill it due to prescription issues.  She reports a family history of diabetes, noting that it 'runs in my family bad,' with her mother having been severely diabetic. She is concerned about her weight and acknowledges that she feels obese. She has previously lost fifteen pounds while on Ozempic  and is motivated to lose more weight to improve her health and potentially reduce her medication burden.         OBJECTIVE:       03/21/2024    1:24 PM  Depression screen PHQ 2/9  Decreased Interest 0  Down, Depressed, Hopeless 0  PHQ - 2 Score 0     BP Readings from Last 3 Encounters:   09/26/24 134/86  03/21/24 (!) 142/84  02/13/24 138/88    BP 134/86   Pulse 83   Wt 246 lb 9.6 oz (111.9 kg)   SpO2 96%   BMI 42.33 kg/m    Physical Exam          Physical Exam Constitutional:      Appearance: Normal appearance.  Cardiovascular:     Rate and Rhythm: Normal rate and regular rhythm.  Neurological:     General: No focal deficit present.     Mental Status: She is alert and oriented to person, place, and time. Mental status is at baseline.     ASSESSMENT/PLAN:   Assessment & Plan Hypertension associated with diabetes (HCC)  Type 2 diabetes mellitus with hyperglycemia, without long-term current use of insulin (HCC)  Mild asthma, unspecified whether complicated, unspecified whether persistent  Annual physical exam  Morbid obesity (HCC)    Assessment and Plan    Adult Wellness Visit Routine wellness visit focused on weight management and metabolic health. - Scheduled Pap smear at women's clinic. - Ordered labs for health assessment. -Comprehensive annual physical exam completed today. Reviewed interval history, current medical issues, medications, allergies, and preventive care needs. Addressed all patient questions and concerns. Discussed lifestyle factors including diet, exercise, sleep, and stress management. Reviewed recommended age-appropriate screenings, labs, and vaccinations. Counseling provided on healthy habits and routine health maintenance. Follow-up as  indicated based on findings and results.   Type 2 diabetes mellitus with hyperglycemia Significantly elevated blood glucose levels. Metformin  recommended for glycemic control. Mounjaro  introduced for weight loss and glycemic control with dose escalation plan. - Prescribed metformin , one pill in the morning and one in the evening. - Prescribed Mounjaro , starting at 2.5 mg, titrate to 10 mg over four months. - Scheduled follow-up in three weeks to assess Mounjaro  response and adjust dosage. -  CBC, CMP AND lIPID PANEL   Essential hypertension Hypertension managed with amlodipine . Lisinopril  recommended for renal protection in diabetes context. - Prescribed lisinopril  for blood pressure management.  Morbid obesity BMI over 40. Weight loss crucial for health improvement and diabetes management. Mounjaro  expected to aid weight loss by reducing appetite. - Prescribed Mounjaro  for weight loss. - Advised on dietary habits to support weight loss.  Asthma Managed with albuterol  inhaler as needed. Only one inhaler available.         Breia Ocampo A. Vita MD [facility]

## 2024-09-26 NOTE — Telephone Encounter (Signed)
 Pharmacy Patient Advocate Encounter   Received notification from [employer] that prior authorization for tirzepatide  (MOUNJARO ) 2.5 MG/0.5ML Pen  is required/requested.   Insurance verification completed.   The patient is insured through [facility] MEDICAID.   Per test claim: PA required; PA submitted to above mentioned insurance via Latent Key/confirmation #/EOC [id number] Status is pending

## 2024-09-27 LAB — COMPREHENSIVE METABOLIC PANEL WITH GFR
ALT: 18 IU/L (ref 0–32)
AST: 19 IU/L (ref 0–40)
Albumin: 4.3 g/dL (ref 3.9–4.9)
Alkaline Phosphatase: 89 IU/L (ref 41–116)
BUN/Creatinine Ratio: 11 (ref 9–23)
BUN: 10 mg/dL (ref 6–24)
Bilirubin Total: 0.5 mg/dL (ref 0.0–1.2)
CO2: 25 mmol/L (ref 20–29)
Calcium: 9.5 mg/dL (ref 8.7–10.2)
Chloride: 100 mmol/L (ref 96–106)
Creatinine, Ser: 0.91 mg/dL (ref 0.57–1.00)
Globulin, Total: 2.7 g/dL (ref 1.5–4.5)
Glucose: 199 mg/dL — AB (ref 70–99)
Potassium: 4.2 mmol/L (ref 3.5–5.2)
Sodium: 136 mmol/L (ref 134–144)
Total Protein: 7 g/dL (ref 6.0–8.5)
eGFR: 79 mL/min/1.73 (ref >59)

## 2024-09-27 LAB — CBC WITH DIFFERENTIAL/PLATELET
Basophils Absolute: 0 x10E3/uL (ref 0.0–0.2)
Basos: 0 %
EOS (ABSOLUTE): 0.1 x10E3/uL (ref 0.0–0.4)
Eos: 1 %
Hematocrit: 38.8 % (ref 34.0–46.6)
Hemoglobin: 12.4 g/dL (ref 11.1–15.9)
Immature Grans (Abs): 0.1 x10E3/uL (ref 0.0–0.1)
Immature Granulocytes: 1 %
Lymphocytes Absolute: 2.7 x10E3/uL (ref 0.7–3.1)
Lymphs: 37 %
MCH: 27.7 pg (ref 26.6–33.0)
MCHC: 32 g/dL (ref 31.5–35.7)
MCV: 87 fL (ref 79–97)
Monocytes Absolute: 0.5 x10E3/uL (ref 0.1–0.9)
Monocytes: 7 %
Neutrophils Absolute: 3.9 x10E3/uL (ref 1.4–7.0)
Neutrophils: 54 %
Platelets: 293 x10E3/uL (ref 150–450)
RBC: 4.47 x10E6/uL (ref 3.77–5.28)
RDW: 13.5 % (ref 11.7–15.4)
WBC: 7.2 x10E3/uL (ref 3.4–10.8)

## 2024-09-27 LAB — LIPID PANEL
Cholesterol, Total: 186 mg/dL (ref 100–199)
HDL: 39 mg/dL — AB (ref >39)
LDL CALC COMMENT:: 4.8 ratio — AB (ref 0.0–4.4)
LDL Chol Calc (NIH): 106 mg/dL — AB (ref 0–99)
Triglycerides: 238 mg/dL — AB (ref 0–149)
VLDL Cholesterol Cal: 41 mg/dL — AB (ref 5–40)

## 2024-09-29 ENCOUNTER — Telehealth: Payer: Self-pay | Admitting: Family Medicine

## 2024-09-29 ENCOUNTER — Telehealth: Payer: Self-pay

## 2024-09-29 ENCOUNTER — Ambulatory Visit: Payer: Self-pay | Admitting: Family Medicine

## 2024-09-29 DIAGNOSIS — E7800 Pure hypercholesterolemia, unspecified: Secondary | ICD-10-CM

## 2024-09-29 MED ORDER — ATORVASTATIN CALCIUM 40 MG PO TABS: 40.0000 mg | ORAL_TABLET | Freq: Every day | ORAL | 3 refills | Status: AC

## 2024-09-29 MED ORDER — TRULICITY 0.75 MG/0.5ML ~~LOC~~ SOAJ: 0.7500 mg | SUBCUTANEOUS | 0 refills | Status: DC

## 2024-09-29 NOTE — Telephone Encounter (Signed)
 Copied from CRM #[id number]. Topic: Clinical - Prescription Issue >> Sep 29, 2024  8:54 AM Berwyn MATSU wrote: Reason for CRM: Patient called in to ask us  to call pharmacy because per patient she was told by pharmacy that they are trying to get a hold of MD and care team.  I advised caller that I will relay message for us  to reach out to pharmacy.  [id number] [place], [place] - [address] AT [place] OF [address] [place] [address] Phone: [phone] Fax: [phone] Hours: Open 24 hours    May you please assist.   Attempted to contact pharmacy was left on hold for 15 minutes.

## 2024-09-29 NOTE — Addendum Note (Signed)
 Addended by: Lenah Messenger on: 09/29/2024 11:12 AM   Modules accepted: Orders

## 2024-09-29 NOTE — Telephone Encounter (Signed)
 Copied from CRM [id number]. Topic: Clinical - Prescription Issue >> Sep 29, 2024 10:55 AM Joesph NOVAK wrote: Reason for CRM: Patient is calling in regards to MOUNJARO . She states pharmacy needs to speak with provider. She is requesting for a nurse to call her.

## 2024-09-29 NOTE — Telephone Encounter (Signed)
 Pharmacy Patient Advocate Encounter  Received notification from [facility] MEDICAID that Prior Authorization for tirzepatide  (MOUNJARO ) 2.5 MG/0.5ML Pen has been DENIED.  Full denial letter will be uploaded to the media tab. See denial reason below.  Per pts plan-pt must trial and fail one of their pref'd drugs first on their formulary.please see below   PA #/Case ID/Reference #: [id number]

## 2024-09-30 ENCOUNTER — Other Ambulatory Visit (HOSPITAL_COMMUNITY): Payer: Self-pay

## 2024-09-30 ENCOUNTER — Telehealth: Payer: Self-pay

## 2024-09-30 NOTE — Telephone Encounter (Signed)
 Pharmacy Patient Advocate Encounter   Received notification from Physician's Office that prior authorization for Dulaglutide  (TRULICITY ) 0.75 MG/0.5ML SOAJ  is required/requested.   Insurance verification completed.   The patient is insured through [facility] MEDICAID.   Per test claim: PA required; PA submitted to above mentioned insurance via Latent Key/confirmation #/EOC [id number] Status is pending

## 2024-10-01 ENCOUNTER — Other Ambulatory Visit (HOSPITAL_COMMUNITY): Payer: Self-pay

## 2024-10-01 NOTE — Telephone Encounter (Signed)
 Pharmacy Patient Advocate Encounter  Received notification from [facility] MEDICAID that Prior Authorization for Trulicity  0.75MG /0.5ML auto-injectors  has been APPROVED from  to 11.25.26. Ran test claim, Copay is $RTS, PT HAS PICKEDUP RX AS OF 11.25.26. This test claim was processed through [facility]- copay amounts may vary at other pharmacies due to pharmacy/plan contracts, or as the patient moves through the different stages of their insurance plan.   Nothing Further needed   PA #/Case ID/Reference #: [id number]

## 2024-10-01 NOTE — Telephone Encounter (Signed)
 Left voicemail for patient

## 2024-10-17 ENCOUNTER — Ambulatory Visit: Admitting: Family Medicine

## 2024-10-17 ENCOUNTER — Encounter: Payer: Self-pay | Admitting: Family Medicine

## 2024-10-17 VITALS — BP 134/82 | HR 84 | Wt 248.6 lb

## 2024-10-17 DIAGNOSIS — E1165 Type 2 diabetes mellitus with hyperglycemia: Secondary | ICD-10-CM

## 2024-10-17 DIAGNOSIS — Z7984 Long term (current) use of oral hypoglycemic drugs: Secondary | ICD-10-CM | POA: Diagnosis not present

## 2024-10-17 DIAGNOSIS — J4540 Moderate persistent asthma, uncomplicated: Secondary | ICD-10-CM | POA: Diagnosis not present

## 2024-10-17 MED ORDER — TRULICITY 1.5 MG/0.5ML ~~LOC~~ SOAJ: 1.5000 mg | SUBCUTANEOUS | 1 refills | Status: DC

## 2024-10-17 MED ORDER — ALBUTEROL SULFATE (2.5 MG/3ML) 0.083% IN NEBU: 2.5000 mg | INHALATION_SOLUTION | Freq: Four times a day (QID) | RESPIRATORY_TRACT | 1 refills | Status: AC | PRN

## 2024-10-17 NOTE — Patient Instructions (Addendum)
 Please ask if wegovy , zepbound  or mounjaro  or prescribed.

## 2024-10-17 NOTE — Progress Notes (Signed)
° °  Name: Amy Reynolds   Date of Visit: 10/17/2024   Date of last visit with me: 09/26/2024   CHIEF COMPLAINT:  Chief Complaint  Patient presents with   Follow-up    3 week follow up, doing good. Does not like the trulicity  does not feel like it does anything for her would like to see if she can go up on dosage.        HPI:  Discussed the use of AI scribe software for clinical note transcription with the patient, who gave verbal consent to proceed.  History of Present Illness   Amy Reynolds is a 43 year old female with type 2 diabetes who presents for follow-up on Trulicity  treatment.  She is currently on Trulicity  for type 2 diabetes management, with the dose recently increased to 1.5 mg due to initial insurance issues. She has not experienced significant weight loss or changes in her condition since starting the medication.  She experiences nocturnal dyspnea and uses a nebulizer machine. She has a history of asthma and sleep apnea, owning a CPAP machine, though it is unclear if she is currently using it. She reports difficulty sleeping and a sensation of breathlessness at night. She regularly uses her inhaler but lacks a mask for her asthma machine.  She expresses frustration with the injection process of Trulicity , noting that the needle is large and causes bleeding, which she finds more bothersome than finger pricking.         OBJECTIVE:       03/21/2024    1:24 PM  Depression screen PHQ 2/9  Decreased Interest 0  Down, Depressed, Hopeless 0  PHQ - 2 Score 0     BP Readings from Last 3 Encounters:  10/17/24 134/82  09/26/24 134/86  03/21/24 (!) 142/84    BP 134/82   Pulse 84   Wt 248 lb 9.6 oz (112.8 kg)   SpO2 98%   BMI 42.67 kg/m    Physical Exam          Physical Exam Constitutional:      Appearance: Normal appearance.  Neurological:     General: No focal deficit present.     Mental Status: She is alert and oriented to person, place, and time.  Mental status is at baseline.     ASSESSMENT/PLAN:   Assessment & Plan Type 2 diabetes mellitus with hyperglycemia, without long-term current use of insulin (HCC)  Moderate persistent asthma, unspecified whether complicated    Assessment and Plan    Type 2 diabetes mellitus with hyperglycemia Currently on Trulicity  with plans to increase dosage for optimal glycemic control. Discussed potential need for alternative medications due to insurance constraints. - Increased Trulicity  to 1.5 mg. - Instructed to message via MyChart in two weeks regarding medication tolerance. - Plan to increase Trulicity  to 3 mg if tolerated. - Consider further increase to 4.5 mg if beneficial. - Repeat A1c in mid-March. - Discuss insurance coverage for Mounjaro , Wegovy , or Zepbound .  Moderate persistent asthma Experiencing nocturnal symptoms despite inhaler use. Lacks mask for nebulizer. - Ensure use of asthma inhaler as prescribed. - Consider obtaining a mask for the nebulizer.         Blessen Kimbrough A. Vita MD [facility]

## 2024-11-03 ENCOUNTER — Telehealth: Payer: Self-pay | Admitting: Internal Medicine

## 2024-11-03 DIAGNOSIS — E1165 Type 2 diabetes mellitus with hyperglycemia: Secondary | ICD-10-CM

## 2024-11-03 MED ORDER — TRULICITY 3 MG/0.5ML ~~LOC~~ SOAJ: 3.0000 mg | SUBCUTANEOUS | 2 refills | Status: AC

## 2024-11-03 NOTE — Telephone Encounter (Signed)
 Copied from CRM #[id number]. Topic: Clinical - Medication Question >> Nov 03, 2024 12:54 PM Selinda RAMAN wrote: Reason for CRM: The patient called in stating she previously talked about upping the dosage if she didn't notice any changes. She says she hasn't really noticed a difference and wonders if upping the dosage will help. She also believes it may require a prior authorization. Please assist patient further as soon as possible.

## 2024-11-20 ENCOUNTER — Ambulatory Visit: Payer: Self-pay | Admitting: Family Medicine

## 2024-11-20 MED ORDER — TIRZEPATIDE 2.5 MG/0.5ML ~~LOC~~ SOAJ: 2.5000 mg | SUBCUTANEOUS | 0 refills | Status: AC

## 2024-11-20 NOTE — Telephone Encounter (Signed)
 Notified patient.

## 2024-11-20 NOTE — Telephone Encounter (Signed)
 FYI Only or Action Required?: Action required by provider: clinical question for provider. Pt would like to switch Trulicity  to a different weight loss med d/t side effects.  Patient was last seen in primary care on 10/17/2024 by Vita Morrow, MD.  Called Nurse Triage reporting Medication Reaction.  Symptoms began several weeks ago.  Interventions attempted: Prescription medications: Trulicity .  Symptoms are: gradually worsening.  Triage Disposition: Call PCP When Office is Open  Patient/caregiver understands and will follow disposition?: Yes   Recently prescribed Trulicity  in November. Would like to changes this medication to a different weight loss med. Has not been helping with weight loss. Has had bloating and headaches and appetite has not been suppressed much. Forwarding request to office.     Copied from CRM [id number]. Topic: Clinical - Medication Question >> Nov 20, 2024 11:54 AM [name] wrote: Reason for CRM: Pt would like to know if her medicine can be changed. She said that trulicity  is giving her headaches, bloated, and hasn't been suppressing appetite as much. Feels like it is not helping with weight loss at all.    Dulaglutide  (TRULICITY ) 3 MG/0.5ML SOAJ  [phone] (M) >> Nov 20, 2024 11:56 AM Rea C wrote: Pt is scheduled to take the medicine tomorrow and she really does not want to. She would like to know if possible to have a change sent in today.  Reason for Disposition  [1] Caller has NON-URGENT medicine question about med that PCP prescribed AND [2] triager unable to answer question  Answer Assessment - Initial Assessment Questions 1. NAME of MEDICINE: What medicine(s) are you calling about?     Trulicity   2. QUESTION: What is your question? (e.g., double dose of medicine, side effect)     Change this med to a different weight loss med that is more effective with less effects  3. PRESCRIBER: Who prescribed the medicine? Reason: if prescribed by  specialist, call should be referred to that group.     Dr. Vita  4. SYMPTOMS: Do you have any symptoms? If Yes, ask: What symptoms are you having?  How bad are the symptoms (e.g., mild, moderate, severe)     Headaches, bloating, not suppressing appetite  5. PREGNANCY:  Is there any chance that you are pregnant? When was your last menstrual period?     Denies  Protocols used: Medication Question Call-A-AH

## 2024-11-21 ENCOUNTER — Other Ambulatory Visit (HOSPITAL_COMMUNITY): Payer: Self-pay

## 2024-11-21 ENCOUNTER — Telehealth: Payer: Self-pay | Admitting: Pharmacy Technician

## 2024-11-21 NOTE — Telephone Encounter (Signed)
 Pharmacy Patient Advocate Encounter   Received notification from [facility] that prior authorization for Mounjaro  2.5MG /0.5ML auto-injectors is required/requested.   Insurance verification completed.   The patient is insured through [facility].   Per test claim: PA required; PA started via CoverMyMeds. KEY [id number] . Waiting for clinical questions to populate.

## 2024-11-21 NOTE — Telephone Encounter (Signed)
 Pharmacy Patient Advocate Encounter  Received notification from [employer] that Prior Authorization for Mounjaro  2.5MG /0.5ML auto-injectors has been APPROVED from 11/21/24 to 11/21/25   PA #/Case ID/Reference #: [id number]
